# Patient Record
Sex: Male | Born: 1970 | Race: White | Hispanic: No | Marital: Single | Smoking: Current every day smoker
Health system: Southern US, Community
[De-identification: ages and names within clinical notes are randomized; demographics above are authoritative.]

## PROBLEM LIST (undated history)

## (undated) DIAGNOSIS — M1990 Unspecified osteoarthritis, unspecified site: Secondary | ICD-10-CM

## (undated) DIAGNOSIS — K219 Gastro-esophageal reflux disease without esophagitis: Secondary | ICD-10-CM

## (undated) DIAGNOSIS — R519 Headache, unspecified: Secondary | ICD-10-CM

## (undated) DIAGNOSIS — I10 Essential (primary) hypertension: Secondary | ICD-10-CM

## (undated) HISTORY — PX: KNEE ARTHROSCOPY: SUR90

## (undated) HISTORY — PX: ADENOIDECTOMY: SUR15

---

## 2003-04-06 ENCOUNTER — Encounter: Payer: Self-pay | Admitting: Emergency Medicine

## 2003-04-06 ENCOUNTER — Emergency Department (HOSPITAL_COMMUNITY): Admission: EM | Admit: 2003-04-06 | Discharge: 2003-04-06 | Payer: Self-pay | Admitting: Emergency Medicine

## 2006-03-28 ENCOUNTER — Emergency Department: Payer: Self-pay | Admitting: Emergency Medicine

## 2007-10-16 ENCOUNTER — Emergency Department (HOSPITAL_COMMUNITY): Admission: EM | Admit: 2007-10-16 | Discharge: 2007-10-16 | Payer: Self-pay | Admitting: Emergency Medicine

## 2007-10-19 ENCOUNTER — Emergency Department (HOSPITAL_COMMUNITY): Admission: EM | Admit: 2007-10-19 | Discharge: 2007-10-19 | Payer: Self-pay | Admitting: Emergency Medicine

## 2007-11-21 ENCOUNTER — Emergency Department: Payer: Self-pay | Admitting: Internal Medicine

## 2008-03-03 ENCOUNTER — Emergency Department: Payer: Self-pay | Admitting: Emergency Medicine

## 2008-04-10 ENCOUNTER — Emergency Department: Payer: Self-pay | Admitting: Internal Medicine

## 2008-04-14 ENCOUNTER — Emergency Department: Payer: Self-pay | Admitting: Emergency Medicine

## 2008-05-30 ENCOUNTER — Emergency Department: Payer: Self-pay | Admitting: Emergency Medicine

## 2008-05-30 ENCOUNTER — Ambulatory Visit: Payer: Self-pay | Admitting: Neurology

## 2008-06-02 ENCOUNTER — Emergency Department: Payer: Self-pay | Admitting: Emergency Medicine

## 2008-06-05 ENCOUNTER — Emergency Department: Payer: Self-pay | Admitting: Emergency Medicine

## 2009-04-15 ENCOUNTER — Ambulatory Visit: Payer: Self-pay | Admitting: Internal Medicine

## 2009-05-29 ENCOUNTER — Emergency Department: Payer: Self-pay | Admitting: Internal Medicine

## 2011-01-18 ENCOUNTER — Emergency Department: Payer: Self-pay | Admitting: Emergency Medicine

## 2011-04-11 LAB — CBC
HCT: 46.1
MCHC: 35.5
MCV: 87.2
RBC: 5.28

## 2011-04-11 LAB — POCT I-STAT, CHEM 8
BUN: 17
Creatinine, Ser: 1.3
Hemoglobin: 16
Sodium: 136
TCO2: 28

## 2011-04-11 LAB — DIFFERENTIAL
Basophils Absolute: 0.1
Eosinophils Absolute: 0.2
Lymphs Abs: 2.1
Monocytes Relative: 6

## 2011-04-11 LAB — ROCKY MTN SPOTTED FVR AB, IGM-BLOOD: RMSF IgM: 0.21 IV

## 2011-04-11 LAB — ROCKY MTN SPOTTED FVR AB, IGG-BLOOD: RMSF IgG: <1:64 {titer}

## 2011-07-29 ENCOUNTER — Emergency Department: Payer: Self-pay | Admitting: Unknown Physician Specialty

## 2011-07-29 LAB — COMPREHENSIVE METABOLIC PANEL
Anion Gap: 11 (ref 7–16)
Bilirubin,Total: 0.9 mg/dL (ref 0.2–1.0)
Chloride: 103 mmol/L (ref 98–107)
Co2: 26 mmol/L (ref 21–32)
Creatinine: 1.03 mg/dL (ref 0.60–1.30)
EGFR (African American): >60
EGFR (Non-African Amer.): >60
Glucose: 121 mg/dL — ABNORMAL HIGH (ref 65–99)
Potassium: 4 mmol/L (ref 3.5–5.1)
SGPT (ALT): 37 U/L
Sodium: 140 mmol/L (ref 136–145)

## 2011-07-29 LAB — CBC
HCT: 49.1 % (ref 40.0–52.0)
HGB: 17 g/dL (ref 13.0–18.0)
MCHC: 34.7 g/dL (ref 32.0–36.0)
Platelet: 229 10*3/uL (ref 150–440)

## 2011-07-29 LAB — TROPONIN I: Troponin-I: <0.02 ng/mL

## 2013-09-09 ENCOUNTER — Ambulatory Visit: Payer: Self-pay

## 2017-01-31 ENCOUNTER — Ambulatory Visit (INDEPENDENT_AMBULATORY_CARE_PROVIDER_SITE_OTHER): Payer: BLUE CROSS/BLUE SHIELD | Admitting: Adult Health

## 2017-01-31 ENCOUNTER — Encounter: Payer: Self-pay | Admitting: Adult Health

## 2017-01-31 VITALS — BP 152/106 | HR 101 | Ht 68.0 in | Wt 247.0 lb

## 2017-01-31 DIAGNOSIS — L0232 Furuncle of buttock: Secondary | ICD-10-CM

## 2017-01-31 DIAGNOSIS — L0291 Cutaneous abscess, unspecified: Secondary | ICD-10-CM | POA: Insufficient documentation

## 2017-01-31 DIAGNOSIS — Z0000 Encounter for general adult medical examination without abnormal findings: Secondary | ICD-10-CM

## 2017-01-31 DIAGNOSIS — I10 Essential (primary) hypertension: Secondary | ICD-10-CM | POA: Insufficient documentation

## 2017-01-31 MED ORDER — LISINOPRIL 10 MG PO TABS: 10.0000 mg | ORAL_TABLET | Freq: Every day | ORAL | 3 refills | Status: DC

## 2017-01-31 MED ORDER — DOXYCYCLINE HYCLATE 100 MG PO TABS: 100.0000 mg | ORAL_TABLET | Freq: Two times a day (BID) | ORAL | 0 refills | Status: DC

## 2017-01-31 NOTE — Patient Instructions (Signed)
Hypertension Hypertension, commonly called high blood pressure, is when the force of blood pumping through the arteries is too strong. The arteries are the blood vessels that carry blood from the heart throughout the body. Hypertension forces the heart to work harder to pump blood and may cause arteries to become narrow or stiff. Having untreated or uncontrolled hypertension can cause heart attacks, strokes, kidney disease, and other problems. A blood pressure reading consists of a higher number over a lower number. Ideally, your blood pressure should be below 120/80. The first ("top") number is called the systolic pressure. It is a measure of the pressure in your arteries as your heart beats. The second ("bottom") number is called the diastolic pressure. It is a measure of the pressure in your arteries as the heart relaxes. What are the causes? The cause of this condition is not known. What increases the risk? Some risk factors for high blood pressure are under your control. Others are not. Factors you can change  Smoking.  Having type 2 diabetes mellitus, high cholesterol, or both.  Not getting enough exercise or physical activity.  Being overweight.  Having too much fat, sugar, calories, or salt (sodium) in your diet.  Drinking too much alcohol. Factors that are difficult or impossible to change  Having chronic kidney disease.  Having a family history of high blood pressure.  Age. Risk increases with age.  Race. You may be at higher risk if you are African-American.  Gender. Men are at higher risk than women before age 45. After age 65, women are at higher risk than men.  Having obstructive sleep apnea.  Stress. What are the signs or symptoms? Extremely high blood pressure (hypertensive crisis) may cause:  Headache.  Anxiety.  Shortness of breath.  Nosebleed.  Nausea and vomiting.  Severe chest pain.  Jerky movements you cannot control (seizures).  How is this  diagnosed? This condition is diagnosed by measuring your blood pressure while you are seated, with your arm resting on a surface. The cuff of the blood pressure monitor will be placed directly against the skin of your upper arm at the level of your heart. It should be measured at least twice using the same arm. Certain conditions can cause a difference in blood pressure between your right and left arms. Certain factors can cause blood pressure readings to be lower or higher than normal (elevated) for a short period of time:  When your blood pressure is higher when you are in a health care provider's office than when you are at home, this is called white coat hypertension. Most people with this condition do not need medicines.  When your blood pressure is higher at home than when you are in a health care provider's office, this is called masked hypertension. Most people with this condition may need medicines to control blood pressure.  If you have a high blood pressure reading during one visit or you have normal blood pressure with other risk factors:  You may be asked to return on a different day to have your blood pressure checked again.  You may be asked to monitor your blood pressure at home for 1 week or longer.  If you are diagnosed with hypertension, you may have other blood or imaging tests to help your health care provider understand your overall risk for other conditions. How is this treated? This condition is treated by making healthy lifestyle changes, such as eating healthy foods, exercising more, and reducing your alcohol intake. Your   health care provider may prescribe medicine if lifestyle changes are not enough to get your blood pressure under control, and if:  Your systolic blood pressure is above 130.  Your diastolic blood pressure is above 80.  Your personal target blood pressure may vary depending on your medical conditions, your age, and other factors. Follow these  instructions at home: Eating and drinking  Eat a diet that is high in fiber and potassium, and low in sodium, added sugar, and fat. An example eating plan is called the DASH (Dietary Approaches to Stop Hypertension) diet. To eat this way: ? Eat plenty of fresh fruits and vegetables. Try to fill half of your plate at each meal with fruits and vegetables. ? Eat whole grains, such as whole wheat pasta, brown rice, or whole grain bread. Fill about one quarter of your plate with whole grains. ? Eat or drink low-fat dairy products, such as skim milk or low-fat yogurt. ? Avoid fatty cuts of meat, processed or cured meats, and poultry with skin. Fill about one quarter of your plate with lean proteins, such as fish, chicken without skin, beans, eggs, and tofu. ? Avoid premade and processed foods. These tend to be higher in sodium, added sugar, and fat.  Reduce your daily sodium intake. Most people with hypertension should eat less than 1,500 mg of sodium a day.  Limit alcohol intake to no more than 1 drink a day for nonpregnant women and 2 drinks a day for men. One drink equals 12 oz of beer, 5 oz of wine, or 1 oz of hard liquor. Lifestyle  Work with your health care provider to maintain a healthy body weight or to lose weight. Ask what an ideal weight is for you.  Get at least 30 minutes of exercise that causes your heart to beat faster (aerobic exercise) most days of the week. Activities may include walking, swimming, or biking.  Include exercise to strengthen your muscles (resistance exercise), such as pilates or lifting weights, as part of your weekly exercise routine. Try to do these types of exercises for 30 minutes at least 3 days a week.  Do not use any products that contain nicotine or tobacco, such as cigarettes and e-cigarettes. If you need help quitting, ask your health care provider.  Monitor your blood pressure at home as told by your health care provider.  Keep all follow-up visits as  told by your health care provider. This is important. Medicines  Take over-the-counter and prescription medicines only as told by your health care provider. Follow directions carefully. Blood pressure medicines must be taken as prescribed.  Do not skip doses of blood pressure medicine. Doing this puts you at risk for problems and can make the medicine less effective.  Ask your health care provider about side effects or reactions to medicines that you should watch for. Contact a health care provider if:  You think you are having a reaction to a medicine you are taking.  You have headaches that keep coming back (recurring).  You feel dizzy.  You have swelling in your ankles.  You have trouble with your vision. Get help right away if:  You develop a severe headache or confusion.  You have unusual weakness or numbness.  You feel faint.  You have severe pain in your chest or abdomen.  You vomit repeatedly.  You have trouble breathing. Summary  Hypertension is when the force of blood pumping through your arteries is too strong. If this condition is not   controlled, it may put you at risk for serious complications.  Your personal target blood pressure may vary depending on your medical conditions, your age, and other factors. For most people, a normal blood pressure is less than 120/80.  Hypertension is treated with lifestyle changes, medicines, or a combination of both. Lifestyle changes include weight loss, eating a healthy, low-sodium diet, exercising more, and limiting alcohol. This information is not intended to replace advice given to you by your health care provider. Make sure you discuss any questions you have with your health care provider. Document Released: 07/04/2005 Document Revised: 06/01/2016 Document Reviewed: 06/01/2016 Elsevier Interactive Patient Education  2018 [employer].   Skin Abscess A skin abscess is an infected area on or under your skin that contains  a collection of pus and other material. An abscess may also be called a furuncle, carbuncle, or boil. An abscess can occur in or on almost any part of your body. Some abscesses break open (rupture) on their own. Most continue to get worse unless they are treated. The infection can spread deeper into the body and eventually into your blood, which can make you feel ill. Treatment usually involves draining the abscess. What are the causes? An abscess occurs when germs, often bacteria, pass through your skin and cause an infection. This may be caused by:  A scrape or cut on your skin.  A puncture wound through your skin, including a needle injection.  Blocked oil or sweat glands.  Blocked and infected hair follicles.  A cyst that forms beneath your skin (sebaceous cyst) and becomes infected.  What increases the risk? This condition is more likely to develop in people who:  Have a weak body defense system (immune system).  Have diabetes.  Have dry and irritated skin.  Get frequent injections or use illegal IV drugs.  Have a foreign body in a wound, such as a splinter.  Have problems with their lymph system or veins.  What are the signs or symptoms? An abscess may start as a painful, firm bump under the skin. Over time, the abscess may get larger or become softer. Pus may appear at the top of the abscess, causing pressure and pain. It may eventually break through the skin and drain. Other symptoms include:  Redness.  Warmth.  Swelling.  Tenderness.  A sore on the skin.  How is this diagnosed? This condition is diagnosed based on your medical history and a physical exam. A sample of pus may be taken from the abscess to find out what is causing the infection and what antibiotics can be used to treat it. You also may have:  Blood tests to look for signs of infection or spread of an infection to your blood.  Imaging studies such as ultrasound, CT scan, or MRI if the abscess is  deep.  How is this treated? Small abscesses that drain on their own may not need treatment. Treatment for an abscess that does not rupture on its own may include:  Warm compresses applied to the area several times per day.  Incision and drainage. Your health care provider will make an incision to open the abscess and will remove pus and any foreign body or dead tissue. The incision area may be packed with gauze to keep it open for a few days while it heals.  Antibiotic medicines to treat infection. For a severe abscess, you may first get antibiotics through an IV and then change to oral antibiotics.  Follow these instructions  at home: Abscess Care  If you have an abscess that has not drained, place a warm, clean, wet washcloth over the abscess several times a day. Do this as told by your health care provider.  Follow instructions from your health care provider about how to take care of your abscess. Make sure you: ? Cover the abscess with a bandage (dressing). ? Change your dressing or gauze as told by your health care provider. ? Wash your hands with soap and water before you change the dressing or gauze. If soap and water are not available, use hand sanitizer.  Check your abscess every day for signs of a worsening infection. Check for: ? More redness, swelling, or pain. ? More fluid or blood. ? Warmth. ? More pus or a bad smell. Medicines  Take over-the-counter and prescription medicines only as told by your health care provider.  If you were prescribed an antibiotic medicine, take it as told by your health care provider. Do not stop taking the antibiotic even if you start to feel better. General instructions  To avoid spreading the infection: ? Do not share personal care items, towels, or hot tubs with others. ? Avoid making skin contact with other people.  Keep all follow-up visits as told by your health care provider. This is important. Contact a health care provider  if:  You have more redness, swelling, or pain around your abscess.  You have more fluid or blood coming from your abscess.  Your abscess feels warm to the touch.  You have more pus or a bad smell coming from your abscess.  You have a fever.  You have muscle aches.  You have chills or a general ill feeling. Get help right away if:  You have severe pain.  You see red streaks on your skin spreading away from the abscess. This information is not intended to replace advice given to you by your health care provider. Make sure you discuss any questions you have with your health care provider. Document Released: 04/13/2005 Document Revised: 02/28/2016 Document Reviewed: 05/13/2015 Elsevier Interactive Patient Education  2018 [employer]  Due to PCN allergy, started on Doxycycline 100mg  twice a day for 10 days-AVOID THE SUN! Keep abscess clean, dry, covered. Will call when lab results are available will call. Start daily Lisinopril 10mg .  Please check BP several times a week ([facility]) and bring log to next appt in 4 weeks. WELCOME TO THE PRACTICE

## 2017-01-31 NOTE — Assessment & Plan Note (Signed)
Hx of HTN and previous antihypertensives. Both BP checks today > 150/100 BMP checked and started on Lisinopril 10mg  daily. Check BP several times week, bring log to next OV. F/u 4 weeks.

## 2017-01-31 NOTE — Progress Notes (Signed)
Subjective:    Patient ID: Gregory Melton, male    DOB: 29-Nov-1970, 46 y.o.   MRN: [id number]  HPI:  Gregory Melton is here to establish as a new pt.  He is a pleasant 46 year old male. PMH:  HTN and recurrent abscesses. He has not had regular medical care > 5 years and has active abscess- anus and R axillary area.  Abscesses formed > 2 weeks ago and the anal abscess spontaneously ruptured this past sat that yielded copious thick yellow/white drainage.  He denies fever/night sweats/poor appetite. He reports calling several UC for treat and he states they couldn't see him "bc we don't look at private parts"-??? He reports drinking > gallon water/day, eats a diet of mostly lean protein , fruits/vegatables/CHOs.  He smokes 1/2 pack a day and has been using tobacco for > 20 years.  He reports drinking lite beer several times a week-however does not interfere with interpersonal relationships or vocational requirements.  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 Of Note:  Denies hx of MRSA infections or anyone at home with active infection.  Patient Care Team    Relationship Specialty Notifications Start End  Gregory Fusi, NP PCP - General Family Medicine  01/31/17     Patient Active Problem List   Diagnosis Date Noted  . Abscess 01/31/2017  . HTN (hypertension) 01/31/2017  . Boil of buttock 01/31/2017  . Healthcare maintenance 01/31/2017     History reviewed. No pertinent past medical history.   Past Surgical History:  Procedure Laterality Date  . ADENOIDECTOMY    . KNEE ARTHROSCOPY Right      Family History  Problem Relation Age of Onset  . Diabetes Maternal Grandmother      History  Drug Use No     History  Alcohol Use  . 3.6 oz/week  . 6 Cans of beer per week     History  Smoking Status  . Current Every Day Smoker  . Packs/day: 0.50  . Types: Cigarettes  . Start date: 07/19/1995  Smokeless Tobacco  . Never Used     Outpatient Encounter Prescriptions as  of 01/31/2017  Medication Sig  . ibuprofen (ADVIL,MOTRIN) 200 MG tablet Take 200 mg by mouth as needed.  . doxycycline (VIBRA-TABS) 100 MG tablet Take 1 tablet (100 mg total) by mouth 2 (two) times daily.  Marland Kitchen lisinopril (PRINIVIL,ZESTRIL) 10 MG tablet Take 1 tablet (10 mg total) by mouth daily.   No facility-administered encounter medications on file as of 01/31/2017.     Allergies: Penicillins  Body mass index is 37.56 kg/m.  Blood pressure (!) 152/106, pulse (!) 101, height 5\' 8"  (1.727 m), weight 247 lb (112 kg).     Review of Systems  Constitutional: Positive for fatigue. Negative for activity change, appetite change, chills, diaphoresis, fever and unexpected weight change.  Eyes: Negative for visual disturbance.  Respiratory: Negative for cough, chest tightness, shortness of breath and wheezing.   Cardiovascular: Negative for chest pain, palpitations and leg swelling.  Gastrointestinal: Negative for abdominal distention, abdominal pain, blood in stool, constipation, diarrhea, nausea and vomiting.  Endocrine: Negative for cold intolerance, heat intolerance, polydipsia, polyphagia and polyuria.  Genitourinary: Negative for difficulty urinating, discharge, flank pain, genital sores, hematuria and penile swelling.  Musculoskeletal: Negative for arthralgias, back pain, gait problem, joint swelling, myalgias and neck stiffness.  Skin: Positive for wound. Negative for color change, pallor and rash.  Neurological: Negative for dizziness, tremors, weakness and headaches.  Hematological: Does not bruise/bleed easily.  Psychiatric/Behavioral: Positive for sleep disturbance.       Objective:   Physical Exam  Constitutional: He is oriented to person, place, and time. He appears well-developed and well-nourished. No distress.  HENT:  Head: Normocephalic and atraumatic.  Right Ear: External ear normal.  Left Ear: External ear normal.  Eyes: Pupils are equal, round, and reactive to  light. Conjunctivae are normal.  Neck: Normal range of motion. Neck supple.  Cardiovascular: Normal rate, regular rhythm, normal heart sounds and intact distal pulses.   No murmur heard. Pulmonary/Chest: Effort normal and breath sounds normal. No respiratory distress. He has no wheezes. He has no rales. He exhibits no tenderness.  Abdominal: Soft. Bowel sounds are normal. He exhibits no distension and no mass. There is no tenderness. There is no rebound and no guarding.  Lymphadenopathy:    He has no cervical adenopathy.  Neurological: He is alert and oriented to person, place, and time.  Skin: Skin is warm and dry. No rash noted. He is not diaphoretic. No erythema. No pallor.     One discrete, open abscess noted on inner aspect of R gluteal fold. Scant clear drainage collected on swab when abscess palpated.  Surrounding tissue intact. No streaking/excessive warmth/redness noted.  Intact raised area of redness R axillary area.  No excessive warmth/streaking noted.  No open tissue noted.   Psychiatric: He has a normal mood and affect. His behavior is normal. Judgment and thought content normal.  Nursing note and vitals reviewed.         Assessment & Plan:   1. Boil of buttock   2. Abscess   3. Essential hypertension   4. Healthcare maintenance     Abscess Anal abscess swabbed-scant to no drainage noted.  Swab sent for culture. PCN allergy Doxy 100mg  BID x 10 days-instructed to avoid sun exposure. Red Flag sx's discussed-advised to call clinic if any noted.   Boil of buttock Anal abscess swabbed-scant to no drainage noted.  Swab sent for culture. PCN allergy Doxy 100mg  BID x 10 days-instructed to avoid sun exposure. Red Flag sx's discussed-advised to call clinic if any noted.  HTN (hypertension) Hx of HTN and previous antihypertensives. Both BP checks today > 150/100 BMP checked and started on Lisinopril 10mg  daily. Check BP several times week, bring log to next  OV. F/u 4 weeks.    FOLLOW-UP:  Return in about 4 weeks (around 02/28/2017) for Regular Follow Up, HTN, Evaluate Medication Effectiveness.

## 2017-01-31 NOTE — Assessment & Plan Note (Signed)
Anal abscess swabbed-scant to no drainage noted.  Swab sent for culture. PCN allergy Doxy 100mg BID x 10 days-instructed to avoid sun exposure. Red Flag sx's discussed-advised to call clinic if any noted. 

## 2017-01-31 NOTE — Assessment & Plan Note (Signed)
Anal abscess swabbed-scant to no drainage noted.  Swab sent for culture. PCN allergy Doxy 100mg  BID x 10 days-instructed to avoid sun exposure. Red Flag sx's discussed-advised to call clinic if any noted.

## 2017-02-01 LAB — BASIC METABOLIC PANEL
BUN/Creatinine Ratio: 15 (ref 9–20)
BUN: 15 mg/dL (ref 6–24)
CALCIUM: 9.7 mg/dL (ref 8.7–10.2)
CO2: 24 mmol/L (ref 20–29)
CREATININE: 0.99 mg/dL (ref 0.76–1.27)
Chloride: 98 mmol/L (ref 96–106)
GFR calc Af Amer: 107 mL/min/{1.73_m2} (ref >59)
GFR, EST NON AFRICAN AMERICAN: 92 mL/min/{1.73_m2} (ref >59)
GLUCOSE: 90 mg/dL (ref 65–99)
Potassium: 4.7 mmol/L (ref 3.5–5.2)
SODIUM: 141 mmol/L (ref 134–144)

## 2017-02-04 LAB — WOUND CULTURE

## 2017-02-06 ENCOUNTER — Encounter: Payer: Self-pay | Admitting: Adult Health

## 2017-02-06 ENCOUNTER — Ambulatory Visit: Payer: BLUE CROSS/BLUE SHIELD | Admitting: Family

## 2017-02-06 ENCOUNTER — Other Ambulatory Visit: Payer: Self-pay | Admitting: Adult Health

## 2017-02-06 MED ORDER — SULFAMETHOXAZOLE-TRIMETHOPRIM 400-80 MG PO TABS: 1.0000 | ORAL_TABLET | Freq: Two times a day (BID) | ORAL | 0 refills | Status: DC

## 2017-02-28 ENCOUNTER — Encounter: Payer: Self-pay | Admitting: Adult Health

## 2017-02-28 ENCOUNTER — Ambulatory Visit (INDEPENDENT_AMBULATORY_CARE_PROVIDER_SITE_OTHER): Payer: BLUE CROSS/BLUE SHIELD | Admitting: Adult Health

## 2017-02-28 VITALS — BP 112/79 | HR 88 | Ht 68.0 in | Wt 245.7 lb

## 2017-02-28 DIAGNOSIS — I10 Essential (primary) hypertension: Secondary | ICD-10-CM | POA: Diagnosis not present

## 2017-02-28 DIAGNOSIS — L0293 Carbuncle, unspecified: Secondary | ICD-10-CM | POA: Diagnosis not present

## 2017-02-28 DIAGNOSIS — L0232 Furuncle of buttock: Secondary | ICD-10-CM | POA: Diagnosis not present

## 2017-02-28 NOTE — Assessment & Plan Note (Signed)
BP at goal 112/79, HR 88 Continue Lisinopril 10mg  daily and heart healthy diet. Continue to push water, avoid soda. CPE with fasting labs in 3-4 months.

## 2017-02-28 NOTE — Progress Notes (Signed)
Subjective:    Patient ID: Gregory Melton, male    DOB: August 19, 1970, 46 y.o.   MRN: [id number]  HPI:  [name] is here for f/u: HTN and recurrent boils.  He has been compliant on Lisinopril 10mg  daily and has been reducing saturated fat/Na++ in diet.   He denies CP/dyspnea/palpitations/dry-cough. He completed Bactrim for boil infection, however boil is still present.  He denies any drainage/pain at site of boil.  He suffered R hand crush injury 2012 and was treated with splinting and large doses of neuropathy medications.  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  He has never been seen by ID or Dermatology for this condition.    Patient Care Team    Relationship Specialty Notifications Start End  Julaine Fusianford, Alianis Trimmer D, NP PCP - General Family Medicine  01/31/17     Patient Active Problem List   Diagnosis Date Noted  . Abscess 01/31/2017  . HTN (hypertension) 01/31/2017  . Boil of buttock 01/31/2017  . Healthcare maintenance 01/31/2017     History reviewed. No pertinent past medical history.   Past Surgical History:  Procedure Laterality Date  . ADENOIDECTOMY    . KNEE ARTHROSCOPY Right      Family History  Problem Relation Age of Onset  . Diabetes Maternal Grandmother      History  Drug Use No     History  Alcohol Use  . 3.6 oz/week  . 6 Cans of beer per week     History  Smoking Status  . Current Every Day Smoker  . Packs/day: 0.50  . Types: Cigarettes  . Start date: 07/19/1995  Smokeless Tobacco  . Never Used     Outpatient Encounter Prescriptions as of 02/28/2017  Medication Sig  . ibuprofen (ADVIL,MOTRIN) 200 MG tablet Take 200 mg by mouth as needed.  Marland Kitchen. lisinopril (PRINIVIL,ZESTRIL) 10 MG tablet Take 1 tablet (10 mg total) by mouth daily.  . [DISCONTINUED] sulfamethoxazole-trimethoprim (BACTRIM,SEPTRA) 400-80 MG tablet Take 1 tablet by mouth 2 (two) times daily.   No facility-administered encounter  medications on file as of 02/28/2017.     Allergies: Penicillins  Body mass index is 37.36 kg/m.  Blood pressure 112/79, pulse 88, height 5\' 8"  (1.727 m), weight 245 lb 11.2 oz (111.4 kg).    Review of Systems  Constitutional: Positive for fatigue. Negative for activity change, appetite change, chills, diaphoresis, fever and unexpected weight change.  Eyes: Negative for visual disturbance.  Respiratory: Negative for cough, chest tightness, shortness of breath, wheezing and stridor.   Cardiovascular: Negative for chest pain, palpitations and leg swelling.  Skin: Positive for wound.  Neurological: Negative for dizziness, weakness and headaches.  Hematological: Does not bruise/bleed easily.  Psychiatric/Behavioral: Negative for decreased concentration and sleep disturbance.       Objective:   Physical Exam  Constitutional: He appears well-developed and well-nourished. No distress.  HENT:  Head: Normocephalic and atraumatic.  Right Ear: External ear normal.  Left Ear: External ear normal.  Cardiovascular: Normal rate, regular rhythm, normal heart sounds and intact distal pulses.   No murmur heard. Pulmonary/Chest: Effort normal and breath sounds normal. No respiratory distress. He has no wheezes. He has no rales. He exhibits no tenderness.  Skin: Skin is warm and dry. No rash noted. He is not diaphoretic. No erythema. No pallor.  Psychiatric: He has a normal mood and affect. His behavior is normal. Judgment and thought content normal.  Nursing note  and vitals reviewed.         Assessment & Plan:   1. Recurrent boils   2. Essential hypertension   3. Boil of buttock     HTN (hypertension) BP at goal 112/79, HR 88 Continue Lisinopril 10mg  daily and heart healthy diet. Continue to push water, avoid soda. CPE with fasting labs in 3-4 months.  Boil of buttock Continues despite completed course of Bactrim. This has been chronic condition since 2012. Dermatology  referral placed.    FOLLOW-UP:  Return in about 3 months (around 05/31/2017) for CPE.

## 2017-02-28 NOTE — Patient Instructions (Signed)
Hypertension Hypertension, commonly called high blood pressure, is when the force of blood pumping through the arteries is too strong. The arteries are the blood vessels that carry blood from the heart throughout the body. Hypertension forces the heart to work harder to pump blood and may cause arteries to become narrow or stiff. Having untreated or uncontrolled hypertension can cause heart attacks, strokes, kidney disease, and other problems. A blood pressure reading consists of a higher number over a lower number. Ideally, your blood pressure should be below 120/80. The first ("top") number is called the systolic pressure. It is a measure of the pressure in your arteries as your heart beats. The second ("bottom") number is called the diastolic pressure. It is a measure of the pressure in your arteries as the heart relaxes. What are the causes? The cause of this condition is not known. What increases the risk? Some risk factors for high blood pressure are under your control. Others are not. Factors you can change  Smoking.  Having type 2 diabetes mellitus, high cholesterol, or both.  Not getting enough exercise or physical activity.  Being overweight.  Having too much fat, sugar, calories, or salt (sodium) in your diet.  Drinking too much alcohol. Factors that are difficult or impossible to change  Having chronic kidney disease.  Having a family history of high blood pressure.  Age. Risk increases with age.  Race. You may be at higher risk if you are African-American.  Gender. Men are at higher risk than women before age 45. After age 65, women are at higher risk than men.  Having obstructive sleep apnea.  Stress. What are the signs or symptoms? Extremely high blood pressure (hypertensive crisis) may cause:  Headache.  Anxiety.  Shortness of breath.  Nosebleed.  Nausea and vomiting.  Severe chest pain.  Jerky movements you cannot control (seizures).  How is this  diagnosed? This condition is diagnosed by measuring your blood pressure while you are seated, with your arm resting on a surface. The cuff of the blood pressure monitor will be placed directly against the skin of your upper arm at the level of your heart. It should be measured at least twice using the same arm. Certain conditions can cause a difference in blood pressure between your right and left arms. Certain factors can cause blood pressure readings to be lower or higher than normal (elevated) for a short period of time:  When your blood pressure is higher when you are in a health care provider's office than when you are at home, this is called white coat hypertension. Most people with this condition do not need medicines.  When your blood pressure is higher at home than when you are in a health care provider's office, this is called masked hypertension. Most people with this condition may need medicines to control blood pressure.  If you have a high blood pressure reading during one visit or you have normal blood pressure with other risk factors:  You may be asked to return on a different day to have your blood pressure checked again.  You may be asked to monitor your blood pressure at home for 1 week or longer.  If you are diagnosed with hypertension, you may have other blood or imaging tests to help your health care provider understand your overall risk for other conditions. How is this treated? This condition is treated by making healthy lifestyle changes, such as eating healthy foods, exercising more, and reducing your alcohol intake. Your   health care provider may prescribe medicine if lifestyle changes are not enough to get your blood pressure under control, and if:  Your systolic blood pressure is above 130.  Your diastolic blood pressure is above 80.  Your personal target blood pressure may vary depending on your medical conditions, your age, and other factors. Follow these  instructions at home: Eating and drinking  Eat a diet that is high in fiber and potassium, and low in sodium, added sugar, and fat. An example eating plan is called the DASH (Dietary Approaches to Stop Hypertension) diet. To eat this way: ? Eat plenty of fresh fruits and vegetables. Try to fill half of your plate at each meal with fruits and vegetables. ? Eat whole grains, such as whole wheat pasta, brown rice, or whole grain bread. Fill about one quarter of your plate with whole grains. ? Eat or drink low-fat dairy products, such as skim milk or low-fat yogurt. ? Avoid fatty cuts of meat, processed or cured meats, and poultry with skin. Fill about one quarter of your plate with lean proteins, such as fish, chicken without skin, beans, eggs, and tofu. ? Avoid premade and processed foods. These tend to be higher in sodium, added sugar, and fat.  Reduce your daily sodium intake. Most people with hypertension should eat less than 1,500 mg of sodium a day.  Limit alcohol intake to no more than 1 drink a day for nonpregnant women and 2 drinks a day for men. One drink equals 12 oz of beer, 5 oz of wine, or 1 oz of hard liquor. Lifestyle  Work with your health care provider to maintain a healthy body weight or to lose weight. Ask what an ideal weight is for you.  Get at least 30 minutes of exercise that causes your heart to beat faster (aerobic exercise) most days of the week. Activities may include walking, swimming, or biking.  Include exercise to strengthen your muscles (resistance exercise), such as pilates or lifting weights, as part of your weekly exercise routine. Try to do these types of exercises for 30 minutes at least 3 days a week.  Do not use any products that contain nicotine or tobacco, such as cigarettes and e-cigarettes. If you need help quitting, ask your health care provider.  Monitor your blood pressure at home as told by your health care provider.  Keep all follow-up visits as  told by your health care provider. This is important. Medicines  Take over-the-counter and prescription medicines only as told by your health care provider. Follow directions carefully. Blood pressure medicines must be taken as prescribed.  Do not skip doses of blood pressure medicine. Doing this puts you at risk for problems and can make the medicine less effective.  Ask your health care provider about side effects or reactions to medicines that you should watch for. Contact a health care provider if:  You think you are having a reaction to a medicine you are taking.  You have headaches that keep coming back (recurring).  You feel dizzy.  You have swelling in your ankles.  You have trouble with your vision. Get help right away if:  You develop a severe headache or confusion.  You have unusual weakness or numbness.  You feel faint.  You have severe pain in your chest or abdomen.  You vomit repeatedly.  You have trouble breathing. Summary  Hypertension is when the force of blood pumping through your arteries is too strong. If this condition is not   controlled, it may put you at risk for serious complications.  Your personal target blood pressure may vary depending on your medical conditions, your age, and other factors. For most people, a normal blood pressure is less than 120/80.  Hypertension is treated with lifestyle changes, medicines, or a combination of both. Lifestyle changes include weight loss, eating a healthy, low-sodium diet, exercising more, and limiting alcohol. This information is not intended to replace advice given to you by your health care provider. Make sure you discuss any questions you have with your health care provider. Document Released: 07/04/2005 Document Revised: 06/01/2016 Document Reviewed: 06/01/2016 Elsevier Interactive Patient Education  2018 [employer]    Heart-Healthy Eating Plan Many factors influence your heart health, including  eating and exercise habits. Heart (coronary) risk increases with abnormal blood fat (lipid) levels. Heart-healthy meal planning includes limiting unhealthy fats, increasing healthy fats, and making other small dietary changes. This includes maintaining a healthy body weight to help keep lipid levels within a normal range. What is my plan? Your health care provider recommends that you:  Get no more than __25__% of the total calories in your daily diet from fat.  Limit your intake of saturated fat to less than __5__% of your total calories each day.  What types of fat should I choose?  Choose healthy fats more often. Choose monounsaturated and polyunsaturated fats, such as olive oil and canola oil, flaxseeds, walnuts, almonds, and seeds.  Eat more omega-3 fats. Good choices include salmon, mackerel, sardines, tuna, flaxseed oil, and ground flaxseeds. Aim to eat fish at least two times each week.  Limit saturated fats. Saturated fats are primarily found in animal products, such as meats, butter, and cream. Plant sources of saturated fats include palm oil, palm kernel oil, and coconut oil.  Avoid foods with partially hydrogenated oils in them. These contain trans fats. Examples of foods that contain trans fats are stick margarine, some tub margarines, cookies, crackers, and other baked goods. What general guidelines do I need to follow?  Check food labels carefully to identify foods with trans fats or high amounts of saturated fat.  Fill one half of your plate with vegetables and green salads. Eat 4-5 servings of vegetables per day. A serving of vegetables equals 1 cup of raw leafy vegetables,  cup of raw or cooked cut-up vegetables, or  cup of vegetable juice.  Fill one fourth of your plate with whole grains. Look for the word "whole" as the first word in the ingredient list.  Fill one fourth of your plate with lean protein foods.  Eat 4-5 servings of fruit per day. A serving of fruit  equals one medium whole fruit,  cup of dried fruit,  cup of fresh, frozen, or canned fruit, or  cup of 100% fruit juice.  Eat more foods that contain soluble fiber. Examples of foods that contain this type of fiber are apples, broccoli, carrots, beans, peas, and barley. Aim to get 20-30 g of fiber per day.  Eat more home-cooked food and less restaurant, buffet, and fast food.  Limit or avoid alcohol.  Limit foods that are high in starch and sugar.  Avoid fried foods.  Cook foods by using methods other than frying. Baking, boiling, grilling, and broiling are all great options. Other fat-reducing suggestions include: ? Removing the skin from poultry. ? Removing all visible fats from meats. ? Skimming the fat off of stews, soups, and gravies before serving them. ? Steaming vegetables in water or broth.  Lose weight if you are overweight. Losing just 5-10% of your initial body weight can help your overall health and prevent diseases such as diabetes and heart disease.  Increase your consumption of nuts, legumes, and seeds to 4-5 servings per week. One serving of dried beans or legumes equals  cup after being cooked, one serving of nuts equals 1 ounces, and one serving of seeds equals  ounce or 1 tablespoon.  You may need to monitor your salt (sodium) intake, especially if you have high blood pressure. Talk with your health care provider or dietitian to get more information about reducing sodium. What foods can I eat? Grains  Breads, including [place], white, pita, wheat, raisin, rye, oatmeal, and [place]. Tortillas that are neither fried nor made with lard or trans fat. Low-fat rolls, including hotdog and hamburger buns and English muffins. Biscuits. Muffins. Waffles. Pancakes. Light popcorn. Whole-grain cereals. Flatbread. Melba toast. Pretzels. Breadsticks. Rusks. Low-fat snacks and crackers, including oyster, saltine, matzo, graham, animal, and rye. Rice and pasta, including brown rice  and those that are made with whole wheat. Vegetables All vegetables. Fruits All fruits, but limit coconut. Meats and Other Protein Sources Lean, well-trimmed beef, veal, pork, and lamb. Chicken and [place] without skin. All fish and shellfish. Wild duck, rabbit, pheasant, and venison. Egg whites or low-cholesterol egg substitutes. Dried beans, peas, lentils, and tofu.Seeds and most nuts. Dairy Low-fat or nonfat cheeses, including ricotta, string, and mozzarella. Skim or 1% milk that is liquid, powdered, or evaporated. Buttermilk that is made with low-fat milk. Nonfat or low-fat yogurt. Beverages Mineral water. Diet carbonated beverages. Sweets and Desserts Sherbets and fruit ices. Honey, jam, marmalade, jelly, and syrups. Meringues and gelatins. Pure sugar candy, such as hard candy, jelly beans, gumdrops, mints, marshmallows, and small amounts of dark chocolate. [employer]. Eat all sweets and desserts in moderation. Fats and Oils Nonhydrogenated (trans-free) margarines. Vegetable oils, including soybean, sesame, sunflower, olive, peanut, safflower, corn, canola, and cottonseed. Salad dressings or mayonnaise that are made with a vegetable oil. Limit added fats and oils that you use for cooking, baking, salads, and as spreads. Other Cocoa powder. Coffee and tea. All seasonings and condiments. The items listed above may not be a complete list of recommended foods or beverages. Contact your dietitian for more options. What foods are not recommended? Grains Breads that are made with saturated or trans fats, oils, or whole milk. Croissants. Butter rolls. Cheese breads. Sweet rolls. Donuts. Buttered popcorn. Chow mein noodles. High-fat crackers, such as cheese or butter crackers. Meats and Other Protein Sources Fatty meats, such as hotdogs, short ribs, sausage, spareribs, bacon, ribeye roast or steak, and mutton. High-fat deli meats, such as salami and bologna. Caviar. Domestic duck and goose.  Organ meats, such as kidney, liver, sweetbreads, brains, gizzard, chitterlings, and heart. Dairy Cream, sour cream, cream cheese, and creamed cottage cheese. Whole milk cheeses, including blue (bleu), [address], [place], [place], [address], [place], [address], [place], [place], and [place]. Whole or 2% milk that is liquid, evaporated, or condensed. Whole buttermilk. Cream sauce or high-fat cheese sauce. Yogurt that is made from whole milk. Beverages Regular sodas and drinks with added sugar. Sweets and Desserts Frosting. Pudding. Cookies. Cakes other than angel food cake. Candy that has milk chocolate or white chocolate, hydrogenated fat, butter, coconut, or unknown ingredients. Buttered syrups. Full-fat ice cream or ice cream drinks. Fats and Oils Gravy that has suet, meat fat, or shortening. Cocoa butter, hydrogenated oils, palm oil, coconut oil, palm kernel oil. These  can often be found in baked products, candy, fried foods, nondairy creamers, and whipped toppings. Solid fats and shortenings, including bacon fat, salt pork, lard, and butter. Nondairy cream substitutes, such as coffee creamers and sour cream substitutes. Salad dressings that are made of unknown oils, cheese, or sour cream. The items listed above may not be a complete list of foods and beverages to avoid. Contact your dietitian for more information. This information is not intended to replace advice given to you by your health care provider. Make sure you discuss any questions you have with your health care provider. Document Released: 04/12/2008 Document Revised: 01/22/2016 Document Reviewed: 12/26/2013 Elsevier Interactive Patient Education  2017 Elsevier Inc.  Continue Lisinopril 10mg  daily-your blood pressure looks GREAT! Continue excellent water intake and meal prep heart heathy diet. Dermatology referral placed to address recurrent boils. Please schedule complete physical with fasting labs in 3-4 months. AGAIN GREAT JOB ON THE  BLOOD PRESSURE!!!

## 2017-02-28 NOTE — Assessment & Plan Note (Signed)
Continues despite completed course of Bactrim. This has been chronic condition since 2012. Dermatology referral placed.

## 2017-03-28 DIAGNOSIS — L732 Hidradenitis suppurativa: Secondary | ICD-10-CM | POA: Diagnosis not present

## 2017-03-28 DIAGNOSIS — L089 Local infection of the skin and subcutaneous tissue, unspecified: Secondary | ICD-10-CM | POA: Diagnosis not present

## 2017-03-28 DIAGNOSIS — L0292 Furuncle, unspecified: Secondary | ICD-10-CM | POA: Diagnosis not present

## 2017-05-29 NOTE — Progress Notes (Signed)
Subjective:    Patient ID: Gregory Melton, male    DOB: 1971/03/12, 46 y.o.   MRN: [id number]  HPI: 02/28/17 OV: Gregory Melton is here for f/u: HTN and recurrent boils.  He has been compliant on Lisinopril 10mg  daily and has been reducing saturated fat/Na++ in diet.   He denies CP/dyspnea/palpitations/dry-cough. He completed Bactrim for boil infection, however boil is still present.  He denies any drainage/pain at site of boil.  He suffered R hand crush injury 2012 and was treated with splinting and large doses of neuropathy medications.  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  He has never been seen by ID or Dermatology for this condition.    05/31/17 OV: Gregory Melton is here for CPE with fasting labs.  He has only been taking his Lisinopril 10mg  every few days and has developed a dry, constant cough that last month.  He drinks 80 oz water a day and has a diet high in saturated fat/sugar/CHO.  He continues to smoke, 4-5 cigarettes/day.  He does not exercise regularly.  He reports being tearful and difficulty sleeping since his 29 year old son left for [employer] 3 days ago.  He is under care of dermatology for recurrent boils. Healthcare maintenance: Colonoscopy-not indicated   Patient Care Team    Relationship Specialty Notifications Start End  [place], Jinny Blossom, NP PCP - General Family Medicine  01/31/17     Patient Active Problem List   Diagnosis Date Noted  . Abscess 01/31/2017  . HTN (hypertension) 01/31/2017  . Boil of buttock 01/31/2017  . Healthcare maintenance 01/31/2017     History reviewed. No pertinent past medical history.   Past Surgical History:  Procedure Laterality Date  . ADENOIDECTOMY    . KNEE ARTHROSCOPY Right      Family History  Problem Relation Age of Onset  . Diabetes Maternal Grandmother      Social History   Substance and Sexual Activity  Drug Use No     Social History   Substance and  Sexual Activity  Alcohol Use Yes  . Alcohol/week: 3.6 oz  . Types: 6 Cans of beer per week     Social History   Tobacco Use  Smoking Status Current Every Day Smoker  . Packs/day: 0.50  . Types: Cigarettes  . Start date: 07/19/1995  Smokeless Tobacco Never Used     Outpatient Encounter Medications as of 05/31/2017  Medication Sig  . clindamycin (CLEOCIN) 300 MG capsule Take 300 mg 2 (two) times daily by mouth.  Marland Kitchen ibuprofen (ADVIL,MOTRIN) 200 MG tablet Take 200 mg by mouth as needed.  . [DISCONTINUED] lisinopril (PRINIVIL,ZESTRIL) 10 MG tablet Take 1 tablet (10 mg total) by mouth daily.  Marland Kitchen losartan (COZAAR) 50 MG tablet Take 1 tablet (50 mg total) daily by mouth.   No facility-administered encounter medications on file as of 05/31/2017.     Allergies: Penicillins  Body mass index is 38.38 kg/m.  Blood pressure (!) 157/105, pulse 93, height 5\' 8"  (1.727 m), weight 252 lb 6.4 oz (114.5 kg).    Review of Systems  Constitutional: Positive for fatigue. Negative for activity change, appetite change, chills, diaphoresis, fever and unexpected weight change.  HENT: Positive for congestion.   Eyes: Negative for visual disturbance.  Respiratory: Negative for cough, chest tightness, shortness of breath, wheezing and stridor.   Cardiovascular: Negative for chest pain, palpitations and leg swelling.  Gastrointestinal: Negative for  abdominal distention, anal bleeding, blood in stool, constipation, diarrhea, nausea and vomiting.  Endocrine: Negative for cold intolerance, heat intolerance, polydipsia, polyphagia and polyuria.  Genitourinary: Negative for difficulty urinating, flank pain and hematuria.  Musculoskeletal: Negative for arthralgias, back pain, joint swelling, myalgias, neck pain and neck stiffness.  Skin: Negative for color change, pallor, rash and wound.  Neurological: Negative for dizziness, weakness and headaches.  Hematological: Does not bruise/bleed easily.   Psychiatric/Behavioral: Positive for dysphoric mood and sleep disturbance. Negative for decreased concentration, self-injury and suicidal ideas. The patient is not nervous/anxious and is not hyperactive.        Objective:   Physical Exam  Constitutional: He is oriented to person, place, and time. He appears well-developed and well-nourished. No distress.  HENT:  Head: Normocephalic and atraumatic.  Right Ear: Hearing, tympanic membrane, external ear and ear canal normal. Tympanic membrane is not perforated and not bulging. No decreased hearing is noted.  Left Ear: Hearing, tympanic membrane, external ear and ear canal normal. Tympanic membrane is not perforated and not bulging. No decreased hearing is noted.  Nose: Mucosal edema present. Right sinus exhibits no maxillary sinus tenderness and no frontal sinus tenderness. Left sinus exhibits no maxillary sinus tenderness and no frontal sinus tenderness.  Mouth/Throat: Uvula is midline, oropharynx is clear and moist and mucous membranes are normal. Abnormal dentition. Dental caries present.  Eyes: Conjunctivae are normal. Pupils are equal, round, and reactive to light.  Neck: Normal range of motion. Neck supple.  Cardiovascular: Normal rate, regular rhythm, normal heart sounds and intact distal pulses.  No murmur heard. Pulmonary/Chest: Effort normal and breath sounds normal. No respiratory distress. He has no wheezes. He has no rales. He exhibits no tenderness.  Abdominal: Soft. Bowel sounds are normal. He exhibits no distension and no mass. There is no tenderness. There is no rebound and no guarding.  Protuberant abdomen  Genitourinary: Testes normal. Rectal exam shows guaiac negative stool.  Genitourinary Comments: Unable to feel prostate due to size Chaperone present during examination.  Musculoskeletal: Normal range of motion. He exhibits no edema or tenderness.  Lymphadenopathy:    He has no cervical adenopathy.  Neurological: He is  alert and oriented to person, place, and time. Coordination normal.  Skin: Skin is warm and dry. No rash noted. He is not diaphoretic. No erythema. No pallor.  Psychiatric: He has a normal mood and affect. His behavior is normal. Judgment and thought content normal.  Nursing note and vitals reviewed.         Assessment & Plan:   1. Need for influenza vaccination   2. Healthcare maintenance   3. Hypertension, unspecified type   4. Screening for colon cancer     HTN (hypertension) BP well above goal 157/105, HR 93 R/t not taking medication as directed and emotional stress r/t son recently leaving for [employer]. Developed ACE cough over last month- d/c'd Lisinopril, started on Losartan 50mg  daily Instructed to take daily and record BP each day F/u in 4 weeks  Healthcare maintenance Fasting labs obtained today. Increase water intake, strive for at least 125 ounces/day.   Follow Heart Healthy diet Increase regular exercise.  Recommend at least 30 minutes daily, 5 days per week of walking, jogging, biking, swimming, YouTube/Pinterest workout videos. We will call when you lab results are available. Please check BP daily and bring log to follow-up in 4 weeks.    FOLLOW-UP:  Return in about 4 weeks (around 06/28/2017) for Regular Follow Up, HTN,  Depression. 1. Need for influenza vaccination   2. Healthcare maintenance   3. Hypertension, unspecified type   4. Screening for colon cancer

## 2017-05-31 ENCOUNTER — Ambulatory Visit (INDEPENDENT_AMBULATORY_CARE_PROVIDER_SITE_OTHER): Payer: BLUE CROSS/BLUE SHIELD | Admitting: Adult Health

## 2017-05-31 ENCOUNTER — Encounter: Payer: Self-pay | Admitting: Adult Health

## 2017-05-31 VITALS — BP 157/105 | HR 93 | Ht 68.0 in | Wt 252.4 lb

## 2017-05-31 DIAGNOSIS — Z1211 Encounter for screening for malignant neoplasm of colon: Secondary | ICD-10-CM

## 2017-05-31 DIAGNOSIS — Z23 Encounter for immunization: Secondary | ICD-10-CM

## 2017-05-31 DIAGNOSIS — Z0000 Encounter for general adult medical examination without abnormal findings: Secondary | ICD-10-CM | POA: Diagnosis not present

## 2017-05-31 DIAGNOSIS — I10 Essential (primary) hypertension: Secondary | ICD-10-CM

## 2017-05-31 LAB — POC HEMOCCULT BLD/STL (OFFICE/1-CARD/DIAGNOSTIC): FECAL OCCULT BLD: NEGATIVE

## 2017-05-31 MED ORDER — LOSARTAN POTASSIUM 50 MG PO TABS: 50.0000 mg | ORAL_TABLET | Freq: Every day | ORAL | 3 refills | Status: DC

## 2017-05-31 NOTE — Assessment & Plan Note (Addendum)
Fasting labs obtained today. Increase water intake, strive for at least 125 ounces/day.   Follow Heart Healthy diet Increase regular exercise.  Recommend at least 30 minutes daily, 5 days per week of walking, jogging, biking, swimming, YouTube/Pinterest workout videos. We will call when you lab results are available. Please check BP daily and bring log to follow-up in 4 weeks.

## 2017-05-31 NOTE — Patient Instructions (Signed)
Heart-Healthy Eating Plan Many factors influence your heart health, including eating and exercise habits. Heart (coronary) risk increases with abnormal blood fat (lipid) levels. Heart-healthy meal planning includes limiting unhealthy fats, increasing healthy fats, and making other small dietary changes. This includes maintaining a healthy body weight to help keep lipid levels within a normal range. What is my plan? Your health care provider recommends that you:  Get no more than ___25___% of the total calories in your daily diet from fat.  Limit your intake of saturated fat to less than __5___% of your total calories each day.  Limit the amount of cholesterol in your diet to less than ___300___ mg per day.  What types of fat should I choose?  Choose healthy fats more often. Choose monounsaturated and polyunsaturated fats, such as olive oil and canola oil, flaxseeds, walnuts, almonds, and seeds.  Eat more omega-3 fats. Good choices include salmon, mackerel, sardines, tuna, flaxseed oil, and ground flaxseeds. Aim to eat fish at least two times each week.  Limit saturated fats. Saturated fats are primarily found in animal products, such as meats, butter, and cream. Plant sources of saturated fats include palm oil, palm kernel oil, and coconut oil.  Avoid foods with partially hydrogenated oils in them. These contain trans fats. Examples of foods that contain trans fats are stick margarine, some tub margarines, cookies, crackers, and other baked goods. What general guidelines do I need to follow?  Check food labels carefully to identify foods with trans fats or high amounts of saturated fat.  Fill one half of your plate with vegetables and green salads. Eat 4-5 servings of vegetables per day. A serving of vegetables equals 1 cup of raw leafy vegetables,  cup of raw or cooked cut-up vegetables, or  cup of vegetable juice.  Fill one fourth of your plate with whole grains. Look for the word  "whole" as the first word in the ingredient list.  Fill one fourth of your plate with lean protein foods.  Eat 4-5 servings of fruit per day. A serving of fruit equals one medium whole fruit,  cup of dried fruit,  cup of fresh, frozen, or canned fruit, or  cup of 100% fruit juice.  Eat more foods that contain soluble fiber. Examples of foods that contain this type of fiber are apples, broccoli, carrots, beans, peas, and barley. Aim to get 20-30 g of fiber per day.  Eat more home-cooked food and less restaurant, buffet, and fast food.  Limit or avoid alcohol.  Limit foods that are high in starch and sugar.  Avoid fried foods.  Cook foods by using methods other than frying. Baking, boiling, grilling, and broiling are all great options. Other fat-reducing suggestions include: ? Removing the skin from poultry. ? Removing all visible fats from meats. ? Skimming the fat off of stews, soups, and gravies before serving them. ? Steaming vegetables in water or broth.  Lose weight if you are overweight. Losing just 5-10% of your initial body weight can help your overall health and prevent diseases such as diabetes and heart disease.  Increase your consumption of nuts, legumes, and seeds to 4-5 servings per week. One serving of dried beans or legumes equals  cup after being cooked, one serving of nuts equals 1 ounces, and one serving of seeds equals  ounce or 1 tablespoon.  You may need to monitor your salt (sodium) intake, especially if you have high blood pressure. Talk with your health care provider or dietitian to get  more information about reducing sodium. What foods can I eat? Grains  Breads, including French, white, pita, wheat, raisin, rye, oatmeal, and Italian. Tortillas that are neither fried nor made with lard or trans fat. Low-fat rolls, including hotdog and hamburger buns and English muffins. Biscuits. Muffins. Waffles. Pancakes. Light popcorn. Whole-grain cereals. Flatbread.  Melba toast. Pretzels. Breadsticks. Rusks. Low-fat snacks and crackers, including oyster, saltine, matzo, graham, animal, and rye. Rice and pasta, including brown rice and those that are made with whole wheat. Vegetables All vegetables. Fruits All fruits, but limit coconut. Meats and Other Protein Sources Lean, well-trimmed beef, veal, pork, and lamb. Chicken and turkey without skin. All fish and shellfish. Wild duck, rabbit, pheasant, and venison. Egg whites or low-cholesterol egg substitutes. Dried beans, peas, lentils, and tofu.Seeds and most nuts. Dairy Low-fat or nonfat cheeses, including ricotta, string, and mozzarella. Skim or 1% milk that is liquid, powdered, or evaporated. Buttermilk that is made with low-fat milk. Nonfat or low-fat yogurt. Beverages Mineral water. Diet carbonated beverages. Sweets and Desserts Sherbets and fruit ices. Honey, jam, marmalade, jelly, and syrups. Meringues and gelatins. Pure sugar candy, such as hard candy, jelly beans, gumdrops, mints, marshmallows, and small amounts of dark chocolate. Angel food cake. Eat all sweets and desserts in moderation. Fats and Oils Nonhydrogenated (trans-free) margarines. Vegetable oils, including soybean, sesame, sunflower, olive, peanut, safflower, corn, canola, and cottonseed. Salad dressings or mayonnaise that are made with a vegetable oil. Limit added fats and oils that you use for cooking, baking, salads, and as spreads. Other Cocoa powder. Coffee and tea. All seasonings and condiments. The items listed above may not be a complete list of recommended foods or beverages. Contact your dietitian for more options. What foods are not recommended? Grains Breads that are made with saturated or trans fats, oils, or whole milk. Croissants. Butter rolls. Cheese breads. Sweet rolls. Donuts. Buttered popcorn. Chow mein noodles. High-fat crackers, such as cheese or butter crackers. Meats and Other Protein Sources Fatty meats, such  as hotdogs, short ribs, sausage, spareribs, bacon, ribeye roast or steak, and mutton. High-fat deli meats, such as salami and bologna. Caviar. Domestic duck and goose. Organ meats, such as kidney, liver, sweetbreads, brains, gizzard, chitterlings, and heart. Dairy Cream, sour cream, cream cheese, and creamed cottage cheese. Whole milk cheeses, including blue (bleu), Monterey Jack, Brie, [place], American, Havarti, Swiss, cheddar, Camembert, and [place]. Whole or 2% milk that is liquid, evaporated, or condensed. Whole buttermilk. Cream sauce or high-fat cheese sauce. Yogurt that is made from whole milk. Beverages Regular sodas and drinks with added sugar. Sweets and Desserts Frosting. Pudding. Cookies. Cakes other than angel food cake. Candy that has milk chocolate or white chocolate, hydrogenated fat, butter, coconut, or unknown ingredients. Buttered syrups. Full-fat ice cream or ice cream drinks. Fats and Oils Gravy that has suet, meat fat, or shortening. Cocoa butter, hydrogenated oils, palm oil, coconut oil, palm kernel oil. These can often be found in baked products, candy, fried foods, nondairy creamers, and whipped toppings. Solid fats and shortenings, including bacon fat, salt pork, lard, and butter. Nondairy cream substitutes, such as coffee creamers and sour cream substitutes. Salad dressings that are made of unknown oils, cheese, or sour cream. The items listed above may not be a complete list of foods and beverages to avoid. Contact your dietitian for more information. This information is not intended to replace advice given to you by your health care provider. Make sure you discuss any questions you have with your health care   provider. Document Released: 04/12/2008 Document Revised: 01/22/2016 Document Reviewed: 12/26/2013 Elsevier Interactive Patient Education  2017 [place].   Hypertension Hypertension, commonly called high blood pressure, is when the force of blood pumping through  the arteries is too strong. The arteries are the blood vessels that carry blood from the heart throughout the body. Hypertension forces the heart to work harder to pump blood and may cause arteries to become narrow or stiff. Having untreated or uncontrolled hypertension can cause heart attacks, strokes, kidney disease, and other problems. A blood pressure reading consists of a higher number over a lower number. Ideally, your blood pressure should be below 120/80. The first ("top") number is called the systolic pressure. It is a measure of the pressure in your arteries as your heart beats. The second ("bottom") number is called the diastolic pressure. It is a measure of the pressure in your arteries as the heart relaxes. What are the causes? The cause of this condition is not known. What increases the risk? Some risk factors for high blood pressure are under your control. Others are not. Factors you can change  Smoking.  Having type 2 diabetes mellitus, high cholesterol, or both.  Not getting enough exercise or physical activity.  Being overweight.  Having too much fat, sugar, calories, or salt (sodium) in your diet.  Drinking too much alcohol. Factors that are difficult or impossible to change  Having chronic kidney disease.  Having a family history of high blood pressure.  Age. Risk increases with age.  Race. You may be at higher risk if you are African-American.  Gender. Men are at higher risk than women before age 102. After age 52, women are at higher risk than men.  Having obstructive sleep apnea.  Stress. What are the signs or symptoms? Extremely high blood pressure (hypertensive crisis) may cause:  Headache.  Anxiety.  Shortness of breath.  Nosebleed.  Nausea and vomiting.  Severe chest pain.  Jerky movements you cannot control (seizures).  How is this diagnosed? This condition is diagnosed by measuring your blood pressure while you are seated, with your arm  resting on a surface. The cuff of the blood pressure monitor will be placed directly against the skin of your upper arm at the level of your heart. It should be measured at least twice using the same arm. Certain conditions can cause a difference in blood pressure between your right and left arms. Certain factors can cause blood pressure readings to be lower or higher than normal (elevated) for a short period of time:  When your blood pressure is higher when you are in a health care provider's office than when you are at home, this is called white coat hypertension. Most people with this condition do not need medicines.  When your blood pressure is higher at home than when you are in a health care provider's office, this is called masked hypertension. Most people with this condition may need medicines to control blood pressure.  If you have a high blood pressure reading during one visit or you have normal blood pressure with other risk factors:  You may be asked to return on a different day to have your blood pressure checked again.  You may be asked to monitor your blood pressure at home for 1 week or longer.  If you are diagnosed with hypertension, you may have other blood or imaging tests to help your health care provider understand your overall risk for other conditions. How is this treated? This  condition is treated by making healthy lifestyle changes, such as eating healthy foods, exercising more, and reducing your alcohol intake. Your health care provider may prescribe medicine if lifestyle changes are not enough to get your blood pressure under control, and if:  Your systolic blood pressure is above 130.  Your diastolic blood pressure is above 80.  Your personal target blood pressure may vary depending on your medical conditions, your age, and other factors. Follow these instructions at home: Eating and drinking  Eat a diet that is high in fiber and potassium, and low in sodium,  added sugar, and fat. An example eating plan is called the DASH (Dietary Approaches to Stop Hypertension) diet. To eat this way: ? Eat plenty of fresh fruits and vegetables. Try to fill half of your plate at each meal with fruits and vegetables. ? Eat whole grains, such as whole wheat pasta, brown rice, or whole grain bread. Fill about one quarter of your plate with whole grains. ? Eat or drink low-fat dairy products, such as skim milk or low-fat yogurt. ? Avoid fatty cuts of meat, processed or cured meats, and poultry with skin. Fill about one quarter of your plate with lean proteins, such as fish, chicken without skin, beans, eggs, and tofu. ? Avoid premade and processed foods. These tend to be higher in sodium, added sugar, and fat.  Reduce your daily sodium intake. Most people with hypertension should eat less than 1,500 mg of sodium a day.  Limit alcohol intake to no more than 1 drink a day for nonpregnant women and 2 drinks a day for men. One drink equals 12 oz of beer, 5 oz of wine, or 1 oz of hard liquor. Lifestyle  Work with your health care provider to maintain a healthy body weight or to lose weight. Ask what an ideal weight is for you.  Get at least 30 minutes of exercise that causes your heart to beat faster (aerobic exercise) most days of the week. Activities may include walking, swimming, or biking.  Include exercise to strengthen your muscles (resistance exercise), such as pilates or lifting weights, as part of your weekly exercise routine. Try to do these types of exercises for 30 minutes at least 3 days a week.  Do not use any products that contain nicotine or tobacco, such as cigarettes and e-cigarettes. If you need help quitting, ask your health care provider.  Monitor your blood pressure at home as told by your health care provider.  Keep all follow-up visits as told by your health care provider. This is important. Medicines  Take over-the-counter and prescription  medicines only as told by your health care provider. Follow directions carefully. Blood pressure medicines must be taken as prescribed.  Do not skip doses of blood pressure medicine. Doing this puts you at risk for problems and can make the medicine less effective.  Ask your health care provider about side effects or reactions to medicines that you should watch for. Contact a health care provider if:  You think you are having a reaction to a medicine you are taking.  You have headaches that keep coming back (recurring).  You feel dizzy.  You have swelling in your ankles.  You have trouble with your vision. Get help right away if:  You develop a severe headache or confusion.  You have unusual weakness or numbness.  You feel faint.  You have severe pain in your chest or abdomen.  You vomit repeatedly.  You have trouble breathing.  Summary  Hypertension is when the force of blood pumping through your arteries is too strong. If this condition is not controlled, it may put you at risk for serious complications.  Your personal target blood pressure may vary depending on your medical conditions, your age, and other factors. For most people, a normal blood pressure is less than 120/80.  Hypertension is treated with lifestyle changes, medicines, or a combination of both. Lifestyle changes include weight loss, eating a healthy, low-sodium diet, exercising more, and limiting alcohol. This information is not intended to replace advice given to you by your health care provider. Make sure you discuss any questions you have with your health care provider. Document Released: 07/04/2005 Document Revised: 06/01/2016 Document Reviewed: 06/01/2016 Elsevier Interactive Patient Education  2018 [employer]  Stop Lisinopril (due to cough) and start Losartan 50mg  daily- PLEASE take EVERYday! Increase water intake, strive for at least 125 ounces/day.   Follow Heart Healthy diet Increase regular  exercise.  Recommend at least 30 minutes daily, 5 days per week of walking, jogging, biking, swimming, YouTube/Pinterest workout videos. We will call when you lab results are available. Please check BP daily and bring log to follow-up in 4 weeks. THANK YOU FOR YOUR SON'S SERVICE! NICE TO SEE YOU!

## 2017-05-31 NOTE — Assessment & Plan Note (Signed)
BP well above goal 157/105, HR 93 R/t not taking medication as directed and emotional stress r/t son recently leaving for [employer]. Developed ACE cough over last month- d/c'd Lisinopril, started on Losartan 50mg  daily Instructed to take daily and record BP each day F/u in 4 weeks

## 2017-06-01 LAB — CBC WITH DIFFERENTIAL/PLATELET
BASOS: 1 %
Basophils Absolute: 0.1 10*3/uL (ref 0.0–0.2)
EOS (ABSOLUTE): 0.2 10*3/uL (ref 0.0–0.4)
EOS: 2 %
Hematocrit: 46.2 % (ref 37.5–51.0)
Hemoglobin: 16.8 g/dL (ref 13.0–17.7)
IMMATURE GRANS (ABS): 0.1 10*3/uL (ref 0.0–0.1)
IMMATURE GRANULOCYTES: 1 %
LYMPHS: 25 %
Lymphocytes Absolute: 2.2 10*3/uL (ref 0.7–3.1)
MCH: 31.3 pg (ref 26.6–33.0)
MCHC: 36.4 g/dL — ABNORMAL HIGH (ref 31.5–35.7)
MCV: 86 fL (ref 79–97)
Monocytes Absolute: 0.5 10*3/uL (ref 0.1–0.9)
Monocytes: 6 %
NEUTROS ABS: 5.7 10*3/uL (ref 1.4–7.0)
NEUTROS PCT: 65 %
PLATELETS: 229 10*3/uL (ref 150–379)
RBC: 5.36 x10E6/uL (ref 4.14–5.80)
RDW: 13.6 % (ref 12.3–15.4)
WBC: 8.7 10*3/uL (ref 3.4–10.8)

## 2017-06-01 LAB — LIPID PANEL
CHOLESTEROL TOTAL: 257 mg/dL — AB (ref 100–199)
Chol/HDL Ratio: 8.3 ratio — ABNORMAL HIGH (ref 0.0–5.0)
HDL: 31 mg/dL — ABNORMAL LOW (ref >39)
TRIGLYCERIDES: 844 mg/dL — AB (ref 0–149)

## 2017-06-01 LAB — COMPREHENSIVE METABOLIC PANEL
A/G RATIO: 1.8 (ref 1.2–2.2)
ALT: 32 IU/L (ref 0–44)
AST: 17 IU/L (ref 0–40)
Albumin: 4.6 g/dL (ref 3.5–5.5)
Alkaline Phosphatase: 117 IU/L (ref 39–117)
BUN/Creatinine Ratio: 13 (ref 9–20)
BUN: 13 mg/dL (ref 6–24)
Bilirubin Total: 0.5 mg/dL (ref 0.0–1.2)
CALCIUM: 9.5 mg/dL (ref 8.7–10.2)
CO2: 26 mmol/L (ref 20–29)
CREATININE: 0.97 mg/dL (ref 0.76–1.27)
Chloride: 100 mmol/L (ref 96–106)
GFR, EST AFRICAN AMERICAN: 109 mL/min/{1.73_m2} (ref >59)
GFR, EST NON AFRICAN AMERICAN: 94 mL/min/{1.73_m2} (ref >59)
Globulin, Total: 2.6 g/dL (ref 1.5–4.5)
Glucose: 112 mg/dL — ABNORMAL HIGH (ref 65–99)
Potassium: 4.9 mmol/L (ref 3.5–5.2)
Sodium: 140 mmol/L (ref 134–144)
TOTAL PROTEIN: 7.2 g/dL (ref 6.0–8.5)

## 2017-06-01 LAB — HEMOGLOBIN A1C
ESTIMATED AVERAGE GLUCOSE: 103 mg/dL
Hgb A1c MFr Bld: 5.2 % (ref 4.8–5.6)

## 2017-06-01 LAB — TSH: TSH: 1.76 u[IU]/mL (ref 0.450–4.500)

## 2017-06-01 LAB — VITAMIN D 25 HYDROXY (VIT D DEFICIENCY, FRACTURES): VIT D 25 HYDROXY: 7.5 ng/mL — AB (ref 30.0–100.0)

## 2017-06-02 ENCOUNTER — Other Ambulatory Visit: Payer: Self-pay | Admitting: Adult Health

## 2017-06-02 DIAGNOSIS — F17210 Nicotine dependence, cigarettes, uncomplicated: Secondary | ICD-10-CM | POA: Diagnosis not present

## 2017-06-02 DIAGNOSIS — Z880 Allergy status to penicillin: Secondary | ICD-10-CM | POA: Diagnosis not present

## 2017-06-02 DIAGNOSIS — R42 Dizziness and giddiness: Secondary | ICD-10-CM | POA: Diagnosis not present

## 2017-06-02 DIAGNOSIS — R404 Transient alteration of awareness: Secondary | ICD-10-CM | POA: Diagnosis not present

## 2017-06-02 DIAGNOSIS — R51 Headache: Secondary | ICD-10-CM | POA: Diagnosis not present

## 2017-06-02 DIAGNOSIS — I10 Essential (primary) hypertension: Secondary | ICD-10-CM | POA: Diagnosis not present

## 2017-06-02 DIAGNOSIS — Z79899 Other long term (current) drug therapy: Secondary | ICD-10-CM | POA: Diagnosis not present

## 2017-06-02 DIAGNOSIS — S134XXA Sprain of ligaments of cervical spine, initial encounter: Secondary | ICD-10-CM | POA: Diagnosis not present

## 2017-06-02 MED ORDER — ATORVASTATIN CALCIUM 20 MG PO TABS
10.0000 mg | ORAL_TABLET | Freq: Every day | ORAL | 1 refills | Status: DC
Start: 2017-06-02 — End: 2021-12-21

## 2017-06-02 MED ORDER — VITAMIN D (ERGOCALCIFEROL) 1.25 MG (50000 UNIT) PO CAPS: 50000.0000 [IU] | ORAL_CAPSULE | ORAL | 0 refills | Status: DC

## 2017-06-27 NOTE — Progress Notes (Deleted)
Subjective:    Patient ID: Gregory Melton, male    DOB: Nov 19, 1970, 46 y.o.   MRN: [id number]  HPI: 02/28/17 OV: [name] is here for f/u: HTN and recurrent boils.  He has been compliant on Lisinopril 10mg  daily and has been reducing saturated fat/Na++ in diet.   He denies CP/dyspnea/palpitations/dry-cough. He completed Bactrim for boil infection, however boil is still present.  He denies any drainage/pain at site of boil.  He suffered R hand crush injury 2012 and was treated with splinting and large doses of neuropathy medications.  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  He has never been seen by ID or Dermatology for this condition.    05/31/17 OV: [name] is here for CPE with fasting labs.  He has only been taking his Lisinopril 10mg  every few days and has developed a dry, constant cough that last month.  He drinks 80 oz water a day and has a diet high in saturated fat/sugar/CHO.  He continues to smoke, 4-5 cigarettes/day.  He does not exercise regularly.  He reports being tearful and difficulty sleeping since his 45 year old son left for [employer] 3 days ago.  He is under care of dermatology for recurrent boils. Healthcare maintenance: Colonoscopy-not indicated  06/28/17 OV: [name] is here for f/u: HTN and how he is tolerating Losartan 50mg  daily  Patient Care Team    Relationship Specialty Notifications Start End  Gregory Melton, Gregory D, NP PCP - General Family Medicine  01/31/17     Patient Active Problem List   Diagnosis Date Noted  . Abscess 01/31/2017  . HTN (hypertension) 01/31/2017  . Boil of buttock 01/31/2017  . Healthcare maintenance 01/31/2017     No past medical history on file.   Past Surgical History:  Procedure Laterality Date  . ADENOIDECTOMY    . KNEE ARTHROSCOPY Right      Family History  Problem Relation Age of Onset  . Diabetes Maternal Grandmother      Social History   Substance  and Sexual Activity  Drug Use No     Social History   Substance and Sexual Activity  Alcohol Use Yes  . Alcohol/week: 3.6 oz  . Types: 6 Cans of beer per week     Social History   Tobacco Use  Smoking Status Current Every Day Smoker  . Packs/day: 0.50  . Types: Cigarettes  . Start date: 07/19/1995  Smokeless Tobacco Never Used     Outpatient Encounter Medications as of 06/28/2017  Medication Sig  . atorvastatin (LIPITOR) 20 MG tablet Take 0.5 tablets (10 mg total) daily by mouth.  . clindamycin (CLEOCIN) 300 MG capsule Take 300 mg 2 (two) times daily by mouth.  Marland Kitchen. ibuprofen (ADVIL,MOTRIN) 200 MG tablet Take 200 mg by mouth as needed.  Marland Kitchen. losartan (COZAAR) 50 MG tablet Take 1 tablet (50 mg total) daily by mouth.  . Vitamin Melton, Ergocalciferol, (DRISDOL) 50000 units CAPS capsule Take 1 capsule (50,000 Units total) every 7 (seven) days by mouth.   No facility-administered encounter medications on file as of 06/28/2017.     Allergies: Penicillins  There is no height or weight on file to calculate BMI.  There were no vitals taken for this visit.    Review of Systems  Constitutional: Positive for fatigue. Negative for activity change, appetite change, chills, diaphoresis, fever and unexpected weight change.  HENT: Positive for congestion.   Eyes:  Negative for visual disturbance.  Respiratory: Negative for cough, chest tightness, shortness of breath, wheezing and stridor.   Cardiovascular: Negative for chest pain, palpitations and leg swelling.  Gastrointestinal: Negative for abdominal distention, anal bleeding, blood in stool, constipation, diarrhea, nausea and vomiting.  Endocrine: Negative for cold intolerance, heat intolerance, polydipsia, polyphagia and polyuria.  Genitourinary: Negative for difficulty urinating, flank pain and hematuria.  Musculoskeletal: Negative for arthralgias, back pain, joint swelling, myalgias, neck pain and neck stiffness.  Skin: Negative for  color change, pallor, rash and wound.  Neurological: Negative for dizziness, weakness and headaches.  Hematological: Does not bruise/bleed easily.  Psychiatric/Behavioral: Positive for dysphoric mood and sleep disturbance. Negative for decreased concentration, self-injury and suicidal ideas. The patient is not nervous/anxious and is not hyperactive.        Objective:   Physical Exam  Constitutional: He is oriented to person, place, and time. He appears well-developed and well-nourished. No distress.  HENT:  Head: Normocephalic and atraumatic.  Right Ear: Hearing, tympanic membrane, external ear and ear canal normal. Tympanic membrane is not perforated and not bulging. No decreased hearing is noted.  Left Ear: Hearing, tympanic membrane, external ear and ear canal normal. Tympanic membrane is not perforated and not bulging. No decreased hearing is noted.  Nose: Mucosal edema present. Right sinus exhibits no maxillary sinus tenderness and no frontal sinus tenderness. Left sinus exhibits no maxillary sinus tenderness and no frontal sinus tenderness.  Mouth/Throat: Uvula is midline, oropharynx is clear and moist and mucous membranes are normal. Abnormal dentition. Dental caries present.  Eyes: Conjunctivae are normal. Pupils are equal, round, and reactive to light.  Neck: Normal range of motion. Neck supple.  Cardiovascular: Normal rate, regular rhythm, normal heart sounds and intact distal pulses.  No murmur heard. Pulmonary/Chest: Effort normal and breath sounds normal. No respiratory distress. He has no wheezes. He has no rales. He exhibits no tenderness.  Abdominal: Soft. Bowel sounds are normal. He exhibits no distension and no mass. There is no tenderness. There is no rebound and no guarding.  Protuberant abdomen  Genitourinary: Testes normal. Rectal exam shows guaiac negative stool.  Genitourinary Comments: Unable to feel prostate due to size Chaperone present during examination.   Musculoskeletal: Normal range of motion. He exhibits no edema or tenderness.  Lymphadenopathy:    He has no cervical adenopathy.  Neurological: He is alert and oriented to person, place, and time. Coordination normal.  Skin: Skin is warm and dry. No rash noted. He is not diaphoretic. No erythema. No pallor.  Psychiatric: He has a normal mood and affect. His behavior is normal. Judgment and thought content normal.  Nursing note and vitals reviewed.         Assessment & Plan:   No diagnosis found.  No problem-specific Assessment & Plan notes found for this encounter.    FOLLOW-UP:  No Follow-up on file. No diagnosis found.

## 2017-06-28 ENCOUNTER — Ambulatory Visit: Payer: BLUE CROSS/BLUE SHIELD | Admitting: Adult Health

## 2018-04-30 DIAGNOSIS — H6002 Abscess of left external ear: Secondary | ICD-10-CM | POA: Diagnosis not present

## 2018-10-01 DIAGNOSIS — S39012A Strain of muscle, fascia and tendon of lower back, initial encounter: Secondary | ICD-10-CM | POA: Diagnosis not present

## 2021-12-21 ENCOUNTER — Emergency Department: Payer: Self-pay

## 2021-12-21 ENCOUNTER — Other Ambulatory Visit: Payer: Self-pay

## 2021-12-21 ENCOUNTER — Emergency Department
Admission: EM | Admit: 2021-12-21 | Discharge: 2021-12-21 | Disposition: A | Discharge status: Home / Self Care | Payer: Self-pay | Attending: Emergency Medicine | Admitting: Emergency Medicine

## 2021-12-21 ENCOUNTER — Encounter: Payer: Self-pay | Admitting: *Deleted

## 2021-12-21 DIAGNOSIS — I10 Essential (primary) hypertension: Secondary | ICD-10-CM | POA: Insufficient documentation

## 2021-12-21 DIAGNOSIS — R1033 Periumbilical pain: Secondary | ICD-10-CM

## 2021-12-21 DIAGNOSIS — K429 Umbilical hernia without obstruction or gangrene: Secondary | ICD-10-CM | POA: Insufficient documentation

## 2021-12-21 LAB — COMPREHENSIVE METABOLIC PANEL
ALT: 29 U/L (ref 0–44)
AST: 23 U/L (ref 15–41)
Albumin: 4.4 g/dL (ref 3.5–5.0)
Alkaline Phosphatase: 107 U/L (ref 38–126)
Anion gap: 6 (ref 5–15)
BUN: 14 mg/dL (ref 6–20)
CO2: 27 mmol/L (ref 22–32)
Calcium: 9.5 mg/dL (ref 8.9–10.3)
Chloride: 104 mmol/L (ref 98–111)
Creatinine, Ser: 1.07 mg/dL (ref 0.61–1.24)
GFR, Estimated: >60 mL/min (ref >60)
Glucose, Bld: 93 mg/dL (ref 70–99)
Potassium: 4.3 mmol/L (ref 3.5–5.1)
Sodium: 137 mmol/L (ref 135–145)
Total Bilirubin: 0.9 mg/dL (ref 0.3–1.2)
Total Protein: 7.9 g/dL (ref 6.5–8.1)

## 2021-12-21 LAB — CBC
HCT: 46.7 % (ref 39.0–52.0)
Hemoglobin: 16.4 g/dL (ref 13.0–17.0)
MCH: 30 pg (ref 26.0–34.0)
MCHC: 35.1 g/dL (ref 30.0–36.0)
MCV: 85.5 fL (ref 80.0–100.0)
Platelets: 244 10*3/uL (ref 150–400)
RBC: 5.46 MIL/uL (ref 4.22–5.81)
RDW: 12 % (ref 11.5–15.5)
WBC: 10.6 10*3/uL — ABNORMAL HIGH (ref 4.0–10.5)
nRBC: 0 % (ref 0.0–0.2)

## 2021-12-21 LAB — URINALYSIS, ROUTINE W REFLEX MICROSCOPIC
Bacteria, UA: NONE SEEN
Bilirubin Urine: NEGATIVE
Glucose, UA: NEGATIVE mg/dL
Hgb urine dipstick: NEGATIVE
Ketones, ur: 20 mg/dL — AB
Leukocytes,Ua: NEGATIVE
Nitrite: NEGATIVE
Protein, ur: 30 mg/dL — AB
Specific Gravity, Urine: 1.032 — ABNORMAL HIGH (ref 1.005–1.030)
pH: 5 (ref 5.0–8.0)

## 2021-12-21 LAB — LIPASE, BLOOD: Lipase: 25 U/L (ref 11–51)

## 2021-12-21 MED ORDER — LACTATED RINGERS IV BOLUS
1000.0000 mL | Freq: Once | INTRAVENOUS | Status: AC
  Administered 2021-12-21: 1000 mL via INTRAVENOUS

## 2021-12-21 MED ORDER — SUCRALFATE 1 G PO TABS
1.0000 g | ORAL_TABLET | Freq: Once | ORAL | Status: AC
  Administered 2021-12-21: 1 g via ORAL
  Filled 2021-12-21: qty 1

## 2021-12-21 MED ORDER — FAMOTIDINE 20 MG PO TABS: 20.0000 mg | ORAL_TABLET | Freq: Two times a day (BID) | ORAL | 0 refills | Status: AC

## 2021-12-21 MED ORDER — FAMOTIDINE 20 MG PO TABS
40.0000 mg | ORAL_TABLET | Freq: Once | ORAL | Status: AC
  Administered 2021-12-21: 40 mg via ORAL
  Filled 2021-12-21: qty 2

## 2021-12-21 MED ORDER — METOCLOPRAMIDE HCL 10 MG PO TABS: 10.0000 mg | ORAL_TABLET | Freq: Four times a day (QID) | ORAL | 0 refills | Status: DC | PRN

## 2021-12-21 MED ORDER — IOHEXOL 350 MG/ML SOLN
100.0000 mL | Freq: Once | INTRAVENOUS | Status: AC | PRN
  Administered 2021-12-21: 100 mL via INTRAVENOUS

## 2021-12-21 MED ORDER — METOCLOPRAMIDE HCL 10 MG PO TABS
10.0000 mg | ORAL_TABLET | Freq: Once | ORAL | Status: AC
  Administered 2021-12-21: 10 mg via ORAL
  Filled 2021-12-21: qty 1

## 2021-12-21 MED ORDER — SUCRALFATE 1 G PO TABS: 1.0000 g | ORAL_TABLET | Freq: Four times a day (QID) | ORAL | 1 refills | Status: AC

## 2021-12-21 MED ORDER — PANTOPRAZOLE SODIUM 40 MG IV SOLR
40.0000 mg | Freq: Once | INTRAVENOUS | Status: AC
  Administered 2021-12-21: 40 mg via INTRAVENOUS
  Filled 2021-12-21: qty 10

## 2021-12-21 NOTE — ED Provider Notes (Signed)
[facility] Provider Note    Event Date/Time   First MD Initiated Contact with Patient 12/21/21 1845     (approximate)   History   Chief Complaint: Abdominal Pain   HPI  Gregory Melton is a 51 y.o. male with a past history of hypertension, obesity who comes ED complaining of upper abdominal pain for the past 10 days.  Worse in the morning when waking up.  Alleviated by taking Tums.  Nonradiating.  No vomiting or diarrhea.  No black or bloody stool.  No fever. Patient reports he has been taking ibuprofen and Tylenol daily for years due to chronic musculoskeletal pain.  No recent steroids.  He gets full and has discomfort within a few hours after eating.  This is caused him to decrease his oral intake      Physical Exam   Triage Vital Signs: ED Triage Vitals  Enc Vitals Group     BP 12/21/21 1929 (!) 153/88     Pulse Rate 12/21/21 1929 83     Resp 12/21/21 1929 17     Temp 12/21/21 1929 (!) 97 F (36.1 C)     Temp src --      SpO2 12/21/21 1929 99 %     Weight 12/21/21 1743 243 lb (110.2 kg)     Height 12/21/21 1743 5\' 8"  (1.727 m)     Head Circumference --      Peak Flow --      Pain Score 12/21/21 1743 7     Pain Loc --      Pain Edu? --      Excl. in GC? --     Most recent vital signs: Vitals:   12/21/21 1929  BP: (!) 153/88  Pulse: 83  Resp: 17  Temp: (!) 97 F (36.1 C)  SpO2: 99%    General: Awake, no distress.  CV:  Good peripheral perfusion.  Resp:  Normal effort.  Abd:  No distention.  Soft with diffuse upper abdominal tenderness. Other:  No lower extremity edema.  Dry mucous membranes, no rash   ED Results / Procedures / Treatments   Labs (all labs ordered are listed, but only abnormal results are displayed) Labs Reviewed  CBC - Abnormal; Notable for the following components:      Result Value   WBC 10.6 (*)    All other components within normal limits  URINALYSIS, ROUTINE W REFLEX MICROSCOPIC - Abnormal; Notable  for the following components:   Color, Urine YELLOW (*)    APPearance HAZY (*)    Specific Gravity, Urine 1.032 (*)    Ketones, ur 20 (*)    Protein, ur 30 (*)    Non Squamous Epithelial PRESENT (*)    All other components within normal limits  LIPASE, BLOOD  COMPREHENSIVE METABOLIC PANEL     EKG    RADIOLOGY Ultrasound abdomen reviewed and interpreted by me, appears normal.  No AAA, no cholecystitis, no hydronephrosis.  Radiology report reviewed.  CT abdomen pelvis negative   PROCEDURES:  Procedures   MEDICATIONS ORDERED IN ED: Medications  sucralfate (CARAFATE) tablet 1 g (1 g Oral Given 12/21/21 1926)  metoCLOPramide (REGLAN) tablet 10 mg (10 mg Oral Given 12/21/21 1926)  famotidine (PEPCID) tablet 40 mg (40 mg Oral Given 12/21/21 1925)  pantoprazole (PROTONIX) injection 40 mg (40 mg Intravenous Given 12/21/21 2203)  lactated ringers bolus 1,000 mL (1,000 mLs Intravenous New Bag/Given 12/21/21 2135)  iohexol (OMNIPAQUE) 350 MG/ML injection  100 mL (100 mLs Intravenous Contrast Given 12/21/21 2146)     IMPRESSION / MDM / ASSESSMENT AND PLAN / ED COURSE  I reviewed the triage vital signs and the nursing notes.                              Differential diagnosis includes, but is not limited to, cholecystitis, choledocholithiasis, pancreatitis, gastritis, AAA. Low suspicion of dissection, mesenteric ischemia, or organ infarction.  Patient's presentation is most consistent with acute presentation with potential threat to life or bodily function.  Patient presents with upper abdominal pain over the last 10 days.  Symptomatology most consistent with gastritis due to chronic NSAID use.  Labs vital signs and ultrasound are all unremarkable.  On reassessment, he is not having any improvement in symptoms from taking Reglan Pepcid and Carafate so I will obtain a CT scan and give IV Protonix and fluids for hydration.   ----------------------------------------- 11:19 PM on  12/21/2021 ----------------------------------------- CT a/p negative. Sx now localizing to umbilical hernia, which is small and less than 1 cm in diameter. Soft and easily reducible, nontender. Pt counseled on hernia precautions, f/u gen surg. F/u GI for chronic nsaid use and suspected gastritis, continue antacid regimen.      FINAL CLINICAL IMPRESSION(S) / ED DIAGNOSES   Final diagnoses:  Periumbilical abdominal pain  Umbilical hernia without obstruction and without gangrene     Rx / DC Orders   ED Discharge Orders          Ordered    sucralfate (CARAFATE) 1 g tablet  4 times daily        12/21/21 2318    famotidine (PEPCID) 20 MG tablet  2 times daily        12/21/21 2318    metoCLOPramide (REGLAN) 10 MG tablet  Every 6 hours PRN        12/21/21 2318             Note:  This document was prepared using Dragon voice recognition software and may include unintentional dictation errors.   Sharman Cheek, MD 12/21/21 2320

## 2021-12-21 NOTE — ED Triage Notes (Signed)
Pt has abd pain for 1.5 weeks.  Pt has burning sensation in stomach.  No v/d. No blood in stools.  No urinary sx.  Pt sent [facility] for eval.  Pt alert  speech clear.

## 2021-12-21 NOTE — ED Notes (Signed)
Unable to void at this time.

## 2022-01-03 ENCOUNTER — Encounter: Payer: Self-pay | Admitting: Surgery

## 2022-01-03 ENCOUNTER — Encounter: Payer: Self-pay | Admitting: Gastroenterology

## 2022-01-03 ENCOUNTER — Ambulatory Visit (INDEPENDENT_AMBULATORY_CARE_PROVIDER_SITE_OTHER): Payer: Self-pay | Admitting: Surgery

## 2022-01-03 VITALS — BP 162/112 | HR 96 | Temp 97.9°F | Ht 68.0 in | Wt 244.8 lb

## 2022-01-03 DIAGNOSIS — K429 Umbilical hernia without obstruction or gangrene: Secondary | ICD-10-CM

## 2022-01-03 NOTE — Patient Instructions (Addendum)
Our surgery scheduler Barbara will call you within 24-48 hours to get you scheduled. If you have not heard from her after 48 hours, please call our office. Have the blue sheet available when she calls to write down important information.   If you have any concerns or questions, please feel free to call our office.   Umbilical Hernia, Adult  A hernia is a bulge of tissue that pushes through an opening between muscles. An umbilical hernia happens in the abdomen, near the belly button (umbilicus). The hernia may contain tissues from the small intestine, large intestine, or fatty tissue covering the intestines. Umbilical hernias in adults tend to get worse over time, and they require surgical treatment. There are different types of umbilical hernias, including: Indirect hernia. This type is located just above or below the umbilicus. It is the most common type of umbilical hernia in adults. Direct hernia. This type forms through an opening formed by the umbilicus. Reducible hernia. This type of hernia comes and goes. It may be visible only when you strain, lift something heavy, or cough. This type of hernia can be pushed back into the abdomen (reduced). Incarcerated hernia. This type traps abdominal tissue inside the hernia. This type of hernia cannot be reduced. Strangulated hernia. This type of hernia cuts off blood flow to the tissues inside the hernia. The tissues can start to die if this happens. This type of hernia requires emergency treatment. What are the causes? An umbilical hernia happens when tissue inside the abdomen presses on a weak area of the abdominal muscles. What increases the risk? You may have a greater risk of this condition if you: Are obese. Have had several pregnancies. Have a buildup of fluid inside your abdomen. Have had surgery that weakens the abdominal muscles. What are the signs or symptoms? The main symptom of this condition is a painless bulge at or near the belly  button. A reducible hernia may be visible only when you strain, lift something heavy, or cough. Other symptoms may include: Dull pain. A feeling of pressure. Symptoms of a strangulated hernia may include: Pain that gets increasingly worse. Nausea and vomiting. Pain when pressing on the hernia. Skin over the hernia becoming red or purple. Constipation. Blood in the stool. How is this diagnosed? This condition may be diagnosed based on: A physical exam. You may be asked to cough or strain while standing. These actions increase the pressure inside your abdomen and can force the hernia through the opening in your muscles. Your health care provider may try to reduce the hernia by pressing on it. Your symptoms and medical history. How is this treated? Surgery is the only treatment for an umbilical hernia. Surgery for a strangulated hernia is done as soon as possible. If you have a small hernia that is not incarcerated, you may need to lose weight before having surgery. Follow these instructions at home: Lose weight, if told by your health care provider. Do not try to push the hernia back in. Watch your hernia for any changes in color or size. Tell your health care provider if any changes occur. You may need to avoid activities that increase pressure on your hernia. Do not lift anything that is heavier than 10 lb (4.5 kg), or the limit that you are told, until your health care provider says that it is safe. Take over-the-counter and prescription medicines only as told by your health care provider. Keep all follow-up visits. This is important. Contact a health care   provider if: Your hernia gets larger. Your hernia becomes painful. Get help right away if: You develop sudden, severe pain near the area of your hernia. You have pain as well as nausea or vomiting. You have pain and the skin over your hernia changes color. You develop a fever or chills. Summary A hernia is a bulge of tissue that  pushes through an opening between muscles. An umbilical hernia happens near the belly button. Surgery is the only treatment for an umbilical hernia. Do not try to push your hernia back in. Keep all follow-up visits. This is important. This information is not intended to replace advice given to you by your health care provider. Make sure you discuss any questions you have with your health care provider. Document Revised: 02/10/2020 Document Reviewed: 02/10/2020 Elsevier Patient Education  2023 [employer] Inc[employer]  

## 2022-01-04 ENCOUNTER — Telehealth: Payer: Self-pay | Admitting: Surgery

## 2022-01-04 NOTE — Telephone Encounter (Signed)
Pt has been advised of Pre-Admission date/time, COVID Testing date and Surgery date.  Surgery Date: 01/11/22 Preadmission Testing Date: 01/06/22 (phone 1p-5p) Covid Testing Date: Not needed.    Patient has been made aware to call [phone], between 1-3:00pm the day before surgery, to find out what time to arrive for surgery.

## 2022-01-05 ENCOUNTER — Encounter: Payer: Self-pay | Admitting: Surgery

## 2022-01-05 NOTE — Progress Notes (Signed)
Patient ID: Gregory Melton, male   DOB: Jul 22, 1970, 51 y.o.   MRN: [id number]  HPI BURHAN BARHAM is a 51 y.o. male seen in consultation at the request of Dr. Selina Cooley for.  He reports subacute abdominal pain that started several weeks ago.  The pain seems to be in 2 type of pain 1 is located within the abdominal wall around the umbilical hernia defect.  This is sharp intermittent and worsening with Valsalva and certain activities.  He also had some nonspecific upper abdominal pain that is colicky type and seems to be worsening with certain meals.  He recently underwent an ER visit and appropriate work-up to include a CT scan of the abdomen pelvis that I personally reviewed showing evidence of umbilical hernia.  He does not have some fatty infiltration or his liver and slightly enlarged spleen ( not impressive).  There is no evidence of ascites and there is no evidence of portal hypertension or cirrhosis. He also reports weak he does have a significant anxiety as well. He also has been involved in prior back injuries and does have some chronic pain.  He takes NSAIDs and Tylenol routine basis.  He did have recent lab work including a CBC and CMP that was completely normal.  HPI  History reviewed. No pertinent past medical history.  Past Surgical History:  Procedure Laterality Date   ADENOIDECTOMY     KNEE ARTHROSCOPY Right     Family History  Problem Relation Age of Onset   Diabetes Maternal Grandmother     Social History Social History   Tobacco Use   Smoking status: Every Day    Packs/day: 0.50    Types: Cigarettes    Start date: 07/19/1995   Smokeless tobacco: Never  Vaping Use   Vaping Use: Never used  Substance Use Topics   Alcohol use: Yes    Alcohol/week: 6.0 standard drinks of alcohol    Types: 6 Cans of beer per week   Drug use: No    Allergies  Allergen Reactions   Penicillins     Current Outpatient Medications  Medication Sig Dispense Refill   famotidine (PEPCID) 20  MG tablet Take 1 tablet (20 mg total) by mouth 2 (two) times daily. 60 tablet 0   losartan (COZAAR) 25 MG tablet Take 25 mg by mouth daily.     sucralfate (CARAFATE) 1 g tablet Take 1 tablet (1 g total) by mouth 4 (four) times daily. 120 tablet 1   metoCLOPramide (REGLAN) 10 MG tablet Take 1 tablet (10 mg total) by mouth every 6 (six) hours as needed. (Patient not taking: Reported on 01/03/2022) 30 tablet 0   No current facility-administered medications for this visit.     Review of Systems Full ROS  was asked and was negative except for the information on the HPI  Physical Exam Blood pressure (!) 162/112, pulse 96, temperature 97.9 F (36.6 C), temperature source Oral, height 5\' 8"  (1.727 m), weight 244 lb 12.8 oz (111 kg), SpO2 97 %. CONSTITUTIONAL: NAD, anxious. EYES: Pupils are equal, round, , Sclera are non-icteric. EARS, NOSE, MOUTH AND THROAT: The oropharynx is clear. The oral mucosa is pink and moist. Hearing is intact to voice. LYMPH NODES:  Lymph nodes in the neck are normal. RESPIRATORY:  Lungs are clear. There is normal respiratory effort, with equal breath sounds bilaterally, and without pathologic use of accessory muscles. CARDIOVASCULAR: Heart is regular without murmurs, gallops, or rubs. GI: The abdomen is  soft, 3 cm  umbilical hernia. There are no palpable masses. There is no hepatosplenomegaly. There are normal bowel sounds in all quadrants. GU: Rectal deferred.   MUSCULOSKELETAL: Normal muscle strength and tone. No cyanosis or edema.   SKIN: Turgor is good and there are no pathologic skin lesions or ulcers. NEUROLOGIC: Motor and sensation is grossly normal. Cranial nerves are grossly intact. PSYCH:  Oriented to person, place and time. Affect is normal.  Data Reviewed  I have personally reviewed the patient's imaging, laboratory findings and medical records.    Assessment/Plan.   51 year old male with past potential for multiple etiologies.  He definitely does have  abdominal wall pain related to the umbilical hernia.  I was very candid with him and told him that some of his functional pain will not improve with a hernia operation.  However he does have other symptoms that are clear-cut related to the ventral hernia defect.  I had an extensive discussion with the patient about options to wait for further endoscopic and GI work-up versus proceeding more promptly further with an operation.  He was very deliberate in his decision and he chooses to go ahead and have his hernia fixed.  I do think that we can perform this in an open fashion very effectively and will require mesh.  Procedure discussed with the patient in detail.  Risks, benefits and possible complications including but not limited to: Bleeding, infection, injury to adjacent structures, chronic pain.  I also was very candid that some of the symptoms that are functional will not resolve with repair of hernia.  He is fully aware of this.  Please note that I spent greater than 60 minutes in this encounter including coordination of his care, placing orders,  , providing extensive counseling and performing appropriate documentation  Sterling Big, MD FACS General Surgeon 01/05/2022, 6:39 PM

## 2022-01-06 ENCOUNTER — Encounter
Admission: RE | Admit: 2022-01-06 | Discharge: 2022-01-06 | Disposition: A | Discharge status: Home / Self Care | Payer: Self-pay | Source: Ambulatory Visit | Attending: Surgery | Admitting: Surgery

## 2022-01-06 DIAGNOSIS — I10 Essential (primary) hypertension: Secondary | ICD-10-CM

## 2022-01-06 DIAGNOSIS — Z01818 Encounter for other preprocedural examination: Secondary | ICD-10-CM

## 2022-01-06 HISTORY — DX: Unspecified osteoarthritis, unspecified site: M19.90

## 2022-01-06 HISTORY — DX: Gastro-esophageal reflux disease without esophagitis: K21.9

## 2022-01-06 HISTORY — DX: Headache, unspecified: R51.9

## 2022-01-06 HISTORY — DX: Essential (primary) hypertension: I10

## 2022-01-06 NOTE — Patient Instructions (Addendum)
Your procedure is scheduled on:01-11-22 Tuesday Report to the Registration Desk on the 1st floor of the [facility].Then proceed to the 2nd floor Surgery Desk To find out your arrival time, please call [phone] between 1PM - 3PM on:01-12-22 Monday If your arrival time is 6:00 am, do not arrive prior to that time as the [facility] entrance doors do not open until 6:00 am.  REMEMBER: Instructions that are not followed completely may result in serious medical risk, up to and including death; or upon the discretion of your surgeon and anesthesiologist your surgery may need to be rescheduled.  Do not eat food after midnight the night before surgery.  No gum chewing, lozengers or hard candies.  You may however, drink CLEAR liquids up to 2 hours before you are scheduled to arrive for your surgery. Do not drink anything within 2 hours of your scheduled arrival time.  Clear liquids include: - water  - apple juice without pulp - gatorade (not RED colors) - black coffee or tea (Do NOT add milk or creamers to the coffee or tea) Do NOT drink anything that is not on this list.  TAKE THESE MEDICATIONS THE MORNING OF SURGERY WITH A SIP OF WATER: -famotidine (PEPCID)  One week prior to surgery: Stop Anti-inflammatories (NSAIDS) such as Advil, Aleve, Ibuprofen, Motrin, Naproxen, Naprosyn and Aspirin based products such as Excedrin, Goodys Powder, BC Powder.You may however, take Tylenol if needed for pain up until the day of surgery. Stop ANY OVER THE COUNTER supplements/vitamins NOW (01-06-22) until after surgery.  No Alcohol for 24 hours before or after surgery.  No Smoking including e-cigarettes for 24 hours prior to surgery.  No chewable tobacco products for at least 6 hours prior to surgery.  No nicotine patches on the day of surgery.  Do not use any "recreational" drugs for at least a week prior to your surgery.  Please be advised that the combination of cocaine and anesthesia may have  negative outcomes, up to and including death. If you test positive for cocaine, your surgery will be cancelled.  On the morning of surgery brush your teeth with toothpaste and water, you may rinse your mouth with mouthwash if you wish. Do not swallow any toothpaste or mouthwash.  Use CHG Soap as directed on instruction sheet.  Do not wear jewelry, make-up, hairpins, clips or nail polish.  Do not wear lotions, powders, or perfumes.   Do not shave body from the neck down 48 hours prior to surgery just in case you cut yourself which could leave a site for infection.  Also, freshly shaved skin may become irritated if using the CHG soap.  Contact lenses, hearing aids and dentures may not be worn into surgery.  Do not bring valuables to the hospital. [facility] is not responsible for any missing/lost belongings or valuables.   Notify your doctor if there is any change in your medical condition (cold, fever, infection).  Wear comfortable clothing (specific to your surgery type) to the hospital.  After surgery, you can help prevent lung complications by doing breathing exercises.  Take deep breaths and cough every 1-2 hours. Your doctor may order a device called an Incentive Spirometer to help you take deep breaths. When coughing or sneezing, hold a pillow firmly against your incision with both hands. This is called "splinting." Doing this helps protect your incision. It also decreases belly discomfort.  If you are being admitted to the hospital overnight, leave your suitcase in the car. After  surgery it may be brought to your room.  If you are being discharged the day of surgery, you will not be allowed to drive home. You will need a responsible adult (18 years or older) to drive you home and stay with you that night.   If you are taking public transportation, you will need to have a responsible adult (18 years or older) with you. Please confirm with your physician that it is acceptable  to use public transportation.   Please call the Pre-admissions Testing Dept. at [phone] if you have any questions about these instructions.  Surgery Visitation Policy:  Patients undergoing a surgery or procedure may have two family members or support persons with them as long as the person is not COVID-19 positive or experiencing its symptoms.

## 2022-01-10 ENCOUNTER — Encounter
Admission: RE | Admit: 2022-01-10 | Discharge: 2022-01-10 | Disposition: A | Discharge status: Home / Self Care | Payer: Self-pay | Source: Ambulatory Visit | Attending: Surgery | Admitting: Surgery

## 2022-01-10 DIAGNOSIS — I10 Essential (primary) hypertension: Secondary | ICD-10-CM | POA: Insufficient documentation

## 2022-01-10 DIAGNOSIS — Z01818 Encounter for other preprocedural examination: Secondary | ICD-10-CM

## 2022-01-10 DIAGNOSIS — Z01810 Encounter for preprocedural cardiovascular examination: Secondary | ICD-10-CM | POA: Insufficient documentation

## 2022-01-11 ENCOUNTER — Encounter
Admission: RE | Disposition: A | Discharge status: Home / Self Care | Payer: Self-pay | Source: Ambulatory Visit | Attending: Surgery

## 2022-01-11 ENCOUNTER — Ambulatory Visit: Payer: Self-pay | Admitting: Anesthesiology

## 2022-01-11 ENCOUNTER — Other Ambulatory Visit: Payer: Self-pay

## 2022-01-11 ENCOUNTER — Ambulatory Visit
Admission: RE | Admit: 2022-01-11 | Discharge: 2022-01-11 | Disposition: A | Discharge status: Home / Self Care | Payer: Self-pay | Source: Ambulatory Visit | Attending: Surgery | Admitting: Surgery

## 2022-01-11 ENCOUNTER — Encounter: Payer: Self-pay | Admitting: Surgery

## 2022-01-11 DIAGNOSIS — F17210 Nicotine dependence, cigarettes, uncomplicated: Secondary | ICD-10-CM | POA: Insufficient documentation

## 2022-01-11 DIAGNOSIS — I10 Essential (primary) hypertension: Secondary | ICD-10-CM | POA: Insufficient documentation

## 2022-01-11 DIAGNOSIS — K219 Gastro-esophageal reflux disease without esophagitis: Secondary | ICD-10-CM | POA: Insufficient documentation

## 2022-01-11 DIAGNOSIS — K429 Umbilical hernia without obstruction or gangrene: Secondary | ICD-10-CM | POA: Insufficient documentation

## 2022-01-11 HISTORY — PX: INSERTION OF MESH: SHX5868

## 2022-01-11 HISTORY — PX: UMBILICAL HERNIA REPAIR: SHX196

## 2022-01-11 SURGERY — REPAIR, HERNIA, UMBILICAL, ADULT
Anesthesia: General | Site: Abdomen

## 2022-01-11 MED ORDER — HYDROCODONE-ACETAMINOPHEN 5-325 MG PO TABS
2.0000 | ORAL_TABLET | Freq: Four times a day (QID) | ORAL | Status: DC | PRN
  Administered 2022-01-11: 2 via ORAL

## 2022-01-11 MED ORDER — MIDAZOLAM HCL 2 MG/2ML IJ SOLN
INTRAMUSCULAR | Status: AC
  Filled 2022-01-11: qty 2

## 2022-01-11 MED ORDER — HYDROCODONE-ACETAMINOPHEN 5-325 MG PO TABS: 1.0000 | ORAL_TABLET | ORAL | 0 refills | Status: AC | PRN

## 2022-01-11 MED ORDER — ROCURONIUM BROMIDE 100 MG/10ML IV SOLN
INTRAVENOUS | Status: DC | PRN
  Administered 2022-01-11: 50 mg via INTRAVENOUS

## 2022-01-11 MED ORDER — ALBUTEROL SULFATE HFA 108 (90 BASE) MCG/ACT IN AERS
INHALATION_SPRAY | RESPIRATORY_TRACT | Status: DC | PRN
  Administered 2022-01-11 (×3): 8 via RESPIRATORY_TRACT

## 2022-01-11 MED ORDER — CHLORHEXIDINE GLUCONATE 0.12 % MT SOLN
15.0000 mL | Freq: Once | OROMUCOSAL | Status: AC
  Administered 2022-01-11: 15 mL via OROMUCOSAL

## 2022-01-11 MED ORDER — GABAPENTIN 300 MG PO CAPS
ORAL_CAPSULE | ORAL | Status: AC
  Administered 2022-01-11: 300 mg via ORAL
  Filled 2022-01-11: qty 1

## 2022-01-11 MED ORDER — LACTATED RINGERS IV SOLN: INTRAVENOUS | Status: DC | PRN

## 2022-01-11 MED ORDER — DEXAMETHASONE SODIUM PHOSPHATE 10 MG/ML IJ SOLN
INTRAMUSCULAR | Status: DC | PRN
  Administered 2022-01-11: 10 mg via INTRAVENOUS

## 2022-01-11 MED ORDER — BUPIVACAINE-EPINEPHRINE (PF) 0.25% -1:200000 IJ SOLN
INTRAMUSCULAR | Status: AC
  Filled 2022-01-11: qty 30

## 2022-01-11 MED ORDER — CEFAZOLIN SODIUM-DEXTROSE 2-3 GM-%(50ML) IV SOLR
INTRAVENOUS | Status: DC | PRN
  Administered 2022-01-11: 2 g via INTRAVENOUS

## 2022-01-11 MED ORDER — PROPOFOL 10 MG/ML IV BOLUS
INTRAVENOUS | Status: DC | PRN
  Administered 2022-01-11: 150 mg via INTRAVENOUS

## 2022-01-11 MED ORDER — ORAL CARE MOUTH RINSE: 15.0000 mL | Freq: Once | OROMUCOSAL | Status: AC

## 2022-01-11 MED ORDER — BUPIVACAINE LIPOSOME 1.3 % IJ SUSP
INTRAMUSCULAR | Status: AC
  Filled 2022-01-11: qty 20

## 2022-01-11 MED ORDER — CHLORHEXIDINE GLUCONATE CLOTH 2 % EX PADS
6.0000 | MEDICATED_PAD | Freq: Once | CUTANEOUS | Status: AC
  Administered 2022-01-11: 6 via TOPICAL

## 2022-01-11 MED ORDER — ONDANSETRON HCL 4 MG/2ML IJ SOLN
INTRAMUSCULAR | Status: DC | PRN
  Administered 2022-01-11: 4 mg via INTRAVENOUS

## 2022-01-11 MED ORDER — MIDAZOLAM HCL 2 MG/2ML IJ SOLN
INTRAMUSCULAR | Status: DC | PRN
  Administered 2022-01-11: 2 mg via INTRAVENOUS

## 2022-01-11 MED ORDER — LACTATED RINGERS IV SOLN: INTRAVENOUS | Status: DC

## 2022-01-11 MED ORDER — FENTANYL CITRATE (PF) 100 MCG/2ML IJ SOLN
INTRAMUSCULAR | Status: AC
  Administered 2022-01-11: 50 ug via INTRAVENOUS
  Filled 2022-01-11: qty 2

## 2022-01-11 MED ORDER — LIDOCAINE HCL (CARDIAC) PF 100 MG/5ML IV SOSY
PREFILLED_SYRINGE | INTRAVENOUS | Status: DC | PRN
  Administered 2022-01-11: 60 mg via INTRAVENOUS

## 2022-01-11 MED ORDER — CEFAZOLIN SODIUM-DEXTROSE 2-4 GM/100ML-% IV SOLN: 2.0000 g | INTRAVENOUS | Status: DC

## 2022-01-11 MED ORDER — FENTANYL CITRATE (PF) 100 MCG/2ML IJ SOLN
INTRAMUSCULAR | Status: DC | PRN
  Administered 2022-01-11 (×2): 50 ug via INTRAVENOUS

## 2022-01-11 MED ORDER — FENTANYL CITRATE (PF) 100 MCG/2ML IJ SOLN
INTRAMUSCULAR | Status: AC
  Filled 2022-01-11: qty 2

## 2022-01-11 MED ORDER — GLYCOPYRROLATE 0.2 MG/ML IJ SOLN
INTRAMUSCULAR | Status: DC | PRN
  Administered 2022-01-11: .2 mg via INTRAVENOUS

## 2022-01-11 MED ORDER — ACETAMINOPHEN 500 MG PO TABS: 1000.0000 mg | ORAL_TABLET | ORAL | Status: AC

## 2022-01-11 MED ORDER — CEFAZOLIN SODIUM-DEXTROSE 2-4 GM/100ML-% IV SOLN
INTRAVENOUS | Status: DC
Start: 2022-01-11 — End: 2022-01-11
  Filled 2022-01-11: qty 100

## 2022-01-11 MED ORDER — GABAPENTIN 300 MG PO CAPS: 300.0000 mg | ORAL_CAPSULE | ORAL | Status: AC

## 2022-01-11 MED ORDER — DEXMEDETOMIDINE HCL IN NACL 200 MCG/50ML IV SOLN
INTRAVENOUS | Status: DC | PRN
  Administered 2022-01-11: 8 ug via INTRAVENOUS

## 2022-01-11 MED ORDER — ACETAMINOPHEN 500 MG PO TABS
ORAL_TABLET | ORAL | Status: AC
  Administered 2022-01-11: 1000 mg via ORAL
  Filled 2022-01-11: qty 2

## 2022-01-11 MED ORDER — FENTANYL CITRATE (PF) 100 MCG/2ML IJ SOLN
25.0000 ug | INTRAMUSCULAR | Status: DC | PRN
  Administered 2022-01-11: 50 ug via INTRAVENOUS

## 2022-01-11 MED ORDER — BUPIVACAINE-EPINEPHRINE (PF) 0.25% -1:200000 IJ SOLN
INTRAMUSCULAR | Status: DC | PRN
  Administered 2022-01-11: 50 mL via RESPIRATORY_TRACT

## 2022-01-11 MED ORDER — HYDROCODONE-ACETAMINOPHEN 5-325 MG PO TABS
ORAL_TABLET | ORAL | Status: AC
  Filled 2022-01-11: qty 2

## 2022-01-11 SURGICAL SUPPLY — 34 items
ADH SKN CLS APL DERMABOND .7 (GAUZE/BANDAGES/DRESSINGS) ×2
APL PRP STRL LF DISP 70% ISPRP (MISCELLANEOUS) ×2
APPLIER CLIP 11 MED OPEN (CLIP) ×3
APR CLP MED 11 20 MLT OPN (CLIP) ×2
BLADE CLIPPER SURG (BLADE) ×3 IMPLANT
BLADE SURG 15 STRL LF DISP TIS (BLADE) ×2 IMPLANT
BLADE SURG 15 STRL SS (BLADE) ×3
CHLORAPREP W/TINT 26 (MISCELLANEOUS) ×3 IMPLANT
CLIP APPLIE 11 MED OPEN (CLIP) ×2 IMPLANT
DERMABOND ADVANCED (GAUZE/BANDAGES/DRESSINGS) ×1
DERMABOND ADVANCED .7 DNX12 (GAUZE/BANDAGES/DRESSINGS) IMPLANT
DRAPE INCISE IOBAN 66X45 STRL (DRAPES) ×3 IMPLANT
DRAPE LAPAROTOMY 77X122 PED (DRAPES) ×3 IMPLANT
DRSG TELFA 3X8 NADH (GAUZE/BANDAGES/DRESSINGS) ×3 IMPLANT
ELECT CAUTERY BLADE 6.4 (BLADE) ×3 IMPLANT
ELECT REM PT RETURN 9FT ADLT (ELECTROSURGICAL) ×3
ELECTRODE REM PT RTRN 9FT ADLT (ELECTROSURGICAL) ×2 IMPLANT
GAUZE 4X4 16PLY ~~LOC~~+RFID DBL (SPONGE) ×3 IMPLANT
GLOVE BIO SURGEON STRL SZ7 (GLOVE) ×3 IMPLANT
GOWN STRL REUS W/ TWL LRG LVL3 (GOWN DISPOSABLE) ×4 IMPLANT
GOWN STRL REUS W/TWL LRG LVL3 (GOWN DISPOSABLE) ×6
MANIFOLD NEPTUNE II (INSTRUMENTS) ×3 IMPLANT
MESH VENTRALEX ST 2.5 CRC MED (Mesh General) ×3 IMPLANT
NEEDLE HYPO 22GX1.5 SAFETY (NEEDLE) ×3 IMPLANT
NS IRRIG 500ML POUR BTL (IV SOLUTION) ×3 IMPLANT
PACK BASIN MINOR ARMC (MISCELLANEOUS) ×3 IMPLANT
PAD DRESSING TELFA 3X8 NADH (GAUZE/BANDAGES/DRESSINGS) ×2 IMPLANT
SPONGE T-LAP 18X18 ~~LOC~~+RFID (SPONGE) ×3 IMPLANT
SUT ETHIBOND NAB MO 7 #0 18IN (SUTURE) ×3 IMPLANT
SUT MNCRL AB 4-0 PS2 18 (SUTURE) ×3 IMPLANT
SUT VIC AB 3-0 SH 27 (SUTURE) ×3
SUT VIC AB 3-0 SH 27X BRD (SUTURE) ×2 IMPLANT
SYR 20ML LL LF (SYRINGE) ×3 IMPLANT
WATER STERILE IRR 500ML POUR (IV SOLUTION) ×3 IMPLANT

## 2022-01-11 NOTE — Anesthesia Preprocedure Evaluation (Signed)
Anesthesia Evaluation  Patient identified by MRN, date of birth, ID band Patient awake    Reviewed: Allergy & Precautions, H&P , NPO status , Patient's Chart, lab work & pertinent test results, reviewed documented beta blocker date and time   History of Anesthesia Complications Negative for: history of anesthetic complications  Airway Mallampati: II  TM Distance: >3 FB Neck ROM: full    Dental  (+) Dental Advidsory Given, Teeth Intact   Pulmonary neg shortness of breath, neg sleep apnea, neg COPD, neg recent URI, Current Smoker and Patient abstained from smoking.,    Pulmonary exam normal breath sounds clear to auscultation       Cardiovascular Exercise Tolerance: Good hypertension, (-) angina(-) Past MI and (-) Cardiac Stents Normal cardiovascular exam(-) dysrhythmias (-) Valvular Problems/Murmurs Rhythm:regular Rate:Normal     Neuro/Psych negative neurological ROS  negative psych ROS   GI/Hepatic Neg liver ROS, GERD  ,  Endo/Other  negative endocrine ROS  Renal/GU negative Renal ROS  negative genitourinary   Musculoskeletal   Abdominal   Peds  Hematology negative hematology ROS (+)   Anesthesia Other Findings Past Medical History: No date: Arthritis     Comment:  right hand and wrist and lower back No date: GERD (gastroesophageal reflux disease) No date: Headache     Comment:  migraines No date: Hypertension   Reproductive/Obstetrics negative OB ROS                             Anesthesia Physical Anesthesia Plan  ASA: 2  Anesthesia Plan: General   Post-op Pain Management:    Induction: Intravenous  PONV Risk Score and Plan: 1 and Ondansetron, Dexamethasone, Midazolam and Treatment may vary due to age or medical condition  Airway Management Planned: Oral ETT and LMA  Additional Equipment:   Intra-op Plan:   Post-operative Plan: Extubation in OR  Informed Consent: I  have reviewed the patients History and Physical, chart, labs and discussed the procedure including the risks, benefits and alternatives for the proposed anesthesia with the patient or authorized representative who has indicated his/her understanding and acceptance.     Dental Advisory Given  Plan Discussed with: Anesthesiologist, CRNA and Surgeon  Anesthesia Plan Comments:         Anesthesia Quick Evaluation

## 2022-01-11 NOTE — Transfer of Care (Addendum)
Immediate Anesthesia Transfer of Care Note  Patient: Gregory Melton  Procedure(s) Performed: HERNIA REPAIR UMBILICAL ADULT (Abdomen)  Patient Location: PACU  Anesthesia Type:General  Level of Consciousness: drowsy  Airway & Oxygen Therapy: Patient Spontanous Breathing and Patient connected to face mask oxygen  Post-op Assessment: Report given to RN and Post -op Vital signs reviewed and stable  Post vital signs: Reviewed and stable  Last Vitals:  Vitals Value Taken Time  BP    Temp    Pulse    Resp    SpO2      Last Pain:  Vitals:   01/11/22 0959  TempSrc: Temporal  PainSc: 4          Complications: No notable events documented.

## 2022-01-11 NOTE — Op Note (Addendum)
Umbilical Hernia Repair with Mesh 6.4 cm Ventralex   Pre-operative Diagnosis: umbilical hernia  Post-operative Diagnosis: same  Surgeon: Sterling Big, MD FACS  Anesthesia: Gen. with endotracheal tube   Findings: 3cm cm UH  Estimated Blood Loss: 5cc                 Specimens: sac          Complications: none              Procedure Details  The patient was seen again in the Holding Room. The benefits, complications, treatment options, and expected outcomes were discussed with the patient. The risks of bleeding, infection, recurrence of symptoms, failure to resolve symptoms, bowel injury, mesh placement, mesh infection, any of which could require further surgery were reviewed with the patient. The likelihood of improving the patient's symptoms with return to their baseline status is good.  The patient and/or family concurred with the proposed plan, giving informed consent.  The patient was taken to Operating Room, identified and the procedure verified.  A Time Out was held and the above information confirmed.  Prior to the induction of general anesthesia, antibiotic prophylaxis was administered. VTE prophylaxis was in place. General endotracheal anesthesia was then administered and tolerated well. After the induction, the abdomen was prepped with Chloraprep and draped in the sterile fashion. The patient was positioned in the supine position.  Incision was created with a scalpel over the hernia defect. Electrocautery was used to dissect through subcutaneous tissue, the hernia sac was opened excised. The hernia was measured.  I placed 6.4 cm mesh underlay fashion and secured it to the fascia with u 0 ethibond stitches in four quadrants.  I closed the hernia defect with interrupted 0 Ethibond sutures.  Liposomal marcaine was placed on both sides of the abdominal wall  Incision was closed in a 2 layer fashion with 3-0 Vicryl and 4-0 Monocryl. Dermabond was used to coat the skin. Marcaine  quarter percent with epinephrine and lidocaine 1% was used to inject all the incision sites. Patient tolerated procedure well and there were no immediate complications. Needle and laparotomy counts were correct   Sterling Big, MD, FACS

## 2022-01-12 ENCOUNTER — Encounter: Payer: Self-pay | Admitting: Surgery

## 2022-01-12 LAB — SURGICAL PATHOLOGY

## 2022-01-12 NOTE — Anesthesia Postprocedure Evaluation (Signed)
Anesthesia Post Note  Patient: Gregory Melton  Procedure(s) Performed: HERNIA REPAIR UMBILICAL ADULT (Abdomen) INSERTION OF MESH (Abdomen)  Patient location during evaluation: PACU Anesthesia Type: General Level of consciousness: awake and alert Pain management: pain level controlled Vital Signs Assessment: post-procedure vital signs reviewed and stable Respiratory status: spontaneous breathing, nonlabored ventilation, respiratory function stable and patient connected to nasal cannula oxygen Cardiovascular status: blood pressure returned to baseline and stable Postop Assessment: no apparent nausea or vomiting Anesthetic complications: no   No notable events documented.   Last Vitals:  Vitals:   01/11/22 1215 01/11/22 1220  BP: (!) 141/94 (!) 144/101  Pulse: 98 95  Resp: 15 18  Temp: 36.6 C (!) 36.1 C  SpO2: 94% 94%    Last Pain:  Vitals:   01/11/22 1220  TempSrc: Temporal  PainSc: 0-No pain                 Lenard Simmer
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
## 2022-01-13 ENCOUNTER — Telehealth: Payer: Self-pay | Admitting: *Deleted

## 2022-01-13 NOTE — Telephone Encounter (Signed)
Faxed FMLA to [employer] at [phone] 

## 2022-01-25 ENCOUNTER — Ambulatory Visit: Payer: Self-pay | Admitting: Gastroenterology

## 2022-01-31 ENCOUNTER — Encounter: Payer: Self-pay | Admitting: Surgery

## 2022-01-31 ENCOUNTER — Ambulatory Visit (INDEPENDENT_AMBULATORY_CARE_PROVIDER_SITE_OTHER): Payer: Self-pay | Admitting: Surgery

## 2022-01-31 VITALS — BP 150/95 | HR 96 | Temp 98.5°F | Ht 68.0 in | Wt 249.0 lb

## 2022-01-31 DIAGNOSIS — Z09 Encounter for follow-up examination after completed treatment for conditions other than malignant neoplasm: Secondary | ICD-10-CM

## 2022-01-31 DIAGNOSIS — K429 Umbilical hernia without obstruction or gangrene: Secondary | ICD-10-CM

## 2022-01-31 NOTE — Progress Notes (Signed)
Surgical Consultation  01/31/2022  Gregory Melton is an 51 y.o. male.   Chief Complaint  Patient presents with   Routine Post Op     HPI: 51 year old male status post repair of umbilical hernia with mesh.  Well.  No fevers or chills some discomfort that is expected.  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   GERD (gastroesophageal reflux disease)    Headache    migraines   Hypertension     Past Surgical History:  Procedure Laterality Date   ADENOIDECTOMY     as a child   INSERTION OF MESH  01/11/2022   Procedure: INSERTION OF MESH;  Surgeon: Leafy Ro, MD;  Location: [facility] ORS;  Service: General;;  umbilical   KNEE ARTHROSCOPY Right    early 30's-meniscus tear   UMBILICAL HERNIA REPAIR N/A 01/11/2022   Procedure: HERNIA REPAIR UMBILICAL ADULT;  Surgeon: Leafy Ro, MD;  Location: [facility] ORS;  Service: General;  Laterality: N/A;    Family History  Problem Relation Age of Onset   Diabetes Maternal Grandmother     Social History:  reports that he has been smoking cigarettes. He started smoking about 26 years ago. He has a 10.00 pack-year smoking history. He has never used smokeless tobacco. He reports current alcohol use of about 6.0 standard drinks of alcohol per week. He reports that he does not use drugs.  Allergies:  Allergies  Allergen Reactions   Penicillins     Pt unsure of reaction    Medications reviewed.     ROS Full ROS performed and is otherwise negative other than what is stated in the HPI    BP (!) 150/95   Pulse 96   Temp 98.5 F (36.9 C)   Ht 5\' 8"  (1.727 m)   Wt 249 lb (112.9 kg)   SpO2 96%   BMI 37.86 kg/m   Physical Exam Vitals and nursing note reviewed. Exam conducted with a chaperone present.  Constitutional:      General: He is not in acute distress.    Appearance: Normal appearance.   Cardiovascular:     Rate and Rhythm: Normal rate and regular rhythm.     Heart sounds: No murmur heard. Pulmonary:     Effort: Pulmonary effort is normal. No respiratory distress.     Breath sounds: Normal breath sounds. No stridor. No wheezing.  Abdominal:     General: Abdomen is flat. There is no distension.     Palpations: Abdomen is soft. There is no mass.     Tenderness: There is no abdominal tenderness. There is no guarding or rebound.     Hernia: No hernia is present.     Comments: Incision healing well without infection, no recurrence  Musculoskeletal:        General: No swelling or tenderness. Normal range of motion.     Cervical back: Normal range of motion and neck supple. No rigidity.  Skin:    General: Skin is warm and dry.     Capillary Refill: Capillary refill takes less than 2 seconds.  Neurological:     General: No focal deficit present.     Mental Status: He is alert and oriented to person, place, and time.  Psychiatric:        Mood and Affect: Mood normal.  Behavior: Behavior normal.        Thought Content: Thought content normal.        Judgment: Judgment normal.      Assessment/Plan: Doing very well after hernia repair.  Expected postoperative course.  No heavy lifting for the full 6 weeks.  Work excuse to be done by my CMA. RTC as needed I SPENT  20 in this encounter including coordination of his care, reviewing records performing appropriate mentation  Sterling Big, MD [facility] General Surgeon

## 2022-01-31 NOTE — Patient Instructions (Addendum)
If you have any concerns or questions, please feel free to call our office. Follow up as needed.    GENERAL POST-OPERATIVE PATIENT INSTRUCTIONS   WOUND CARE INSTRUCTIONS:  Keep a dry clean dressing on the wound if there is drainage. The initial bandage may be removed after 24 hours.  Once the wound has quit draining you may leave it open to air.  If clothing rubs against the wound or causes irritation and the wound is not draining you may cover it with a dry dressing during the daytime.  Try to keep the wound dry and avoid ointments on the wound unless directed to do so.  If the wound becomes bright red and painful or starts to drain infected material that is not clear, please contact your physician immediately.  If the wound is mildly pink and has a thick firm ridge underneath it, this is normal, and is referred to as a healing ridge.  This will resolve over the next 4-6 weeks.  BATHING: You may shower if you have been informed of this by your surgeon. However, Please do not submerge in a tub, hot tub, or pool until incisions are completely sealed or have been told by your surgeon that you may do so.  DIET:  You may eat any foods that you can tolerate.  It is a good idea to eat a high fiber diet and take in plenty of fluids to prevent constipation.  If you do become constipated you may want to take a mild laxative or take ducolax tablets on a daily basis until your bowel habits are regular.  Constipation can be very uncomfortable, along with straining, after recent surgery.  ACTIVITY:  You are encouraged to cough and deep breath or use your incentive spirometer if you were given one, every 15-30 minutes when awake.  This will help prevent respiratory complications and low grade fevers post-operatively if you had a general anesthetic.  You may want to hug a pillow when coughing and sneezing to add additional support to the surgical area, if you had abdominal or chest surgery, which will decrease pain  during these times.  You are encouraged to walk and engage in light activity for the next two weeks.  You should not lift more than 20 pounds for 6 weeks total after surgery as it could put you at increased risk for complications.  Twenty pounds is roughly equivalent to a plastic bag of groceries. At that time- Listen to your body when lifting, if you have pain when lifting, stop and then try again in a few days. Soreness after doing exercises or activities of daily living is normal as you get back in to your normal routine.  MEDICATIONS:  Try to take narcotic medications and anti-inflammatory medications, such as tylenol, ibuprofen, naprosyn, etc., with food.  This will minimize stomach upset from the medication.  Should you develop nausea and vomiting from the pain medication, or develop a rash, please discontinue the medication and contact your physician.  You should not drive, make important decisions, or operate machinery when taking narcotic pain medication.  SUNBLOCK Use sun block to incision area over the next year if this area will be exposed to sun. This helps decrease scarring and will allow you avoid a permanent darkened area over your incision.  QUESTIONS:  Please feel free to call our office if you have any questions, and we will be glad to assist you. [phone]   

## 2022-02-03 ENCOUNTER — Telehealth: Payer: Self-pay | Admitting: *Deleted

## 2022-02-03 NOTE — Telephone Encounter (Signed)
Faxed FMLA to [employer] to [phone]

## 2023-03-27 ENCOUNTER — Ambulatory Visit
Admission: EM | Admit: 2023-03-27 | Discharge: 2023-03-27 | Disposition: A | Discharge status: Home / Self Care | Payer: No Typology Code available for payment source | Attending: Emergency Medicine | Admitting: Emergency Medicine

## 2023-03-27 ENCOUNTER — Other Ambulatory Visit: Payer: Self-pay

## 2023-03-27 ENCOUNTER — Encounter: Payer: Self-pay | Admitting: Emergency Medicine

## 2023-03-27 DIAGNOSIS — J069 Acute upper respiratory infection, unspecified: Secondary | ICD-10-CM | POA: Diagnosis not present

## 2023-03-27 MED ORDER — ALBUTEROL SULFATE HFA 108 (90 BASE) MCG/ACT IN AERS: 2.0000 | INHALATION_SPRAY | RESPIRATORY_TRACT | 0 refills | Status: AC | PRN

## 2023-03-27 MED ORDER — AZITHROMYCIN 250 MG PO TABS: 250.0000 mg | ORAL_TABLET | Freq: Every day | ORAL | 0 refills | Status: AC

## 2023-03-27 MED ORDER — PREDNISONE 20 MG PO TABS: 40.0000 mg | ORAL_TABLET | Freq: Every day | ORAL | 0 refills | Status: AC

## 2023-03-27 MED ORDER — BENZONATATE 100 MG PO CAPS: 100.0000 mg | ORAL_CAPSULE | Freq: Three times a day (TID) | ORAL | 0 refills | Status: AC

## 2023-03-27 MED ORDER — PROMETHAZINE-DM 6.25-15 MG/5ML PO SYRP: 5.0000 mL | ORAL_SOLUTION | Freq: Every evening | ORAL | 0 refills | Status: AC | PRN

## 2023-03-27 NOTE — ED Provider Notes (Signed)
Gregory Melton    CSN: [id number] Arrival date & time: 03/27/23  9528      History   Chief Complaint Chief Complaint  Patient presents with   Cough    HPI Gregory Melton is a 52 y.o. male.   Patient presents for evaluation of malaise, fatigue, fever, chills nasal congestion, rhinorrhea, a scratchy throat, productive cough, shortness of breath and wheezing present for 8 days.  Fever peaking at 100.6.  Sputum described as yellow to brown and cough has been persistent and harsh, exacerbated when laying down.  Shortness of breath experienced at rest, exacerbated with exertion.  Wheezing at times triggering cough.  Decreased appetite but tolerating some food and fluids.  Home COVID test negative.  Sick contact in household.  Daily tobacco use.  History of childhood asthma.  History of bronchitis and pneumonia.  Has attempted use of NyQuil, DayQuil and TheraFlu.  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   GERD (gastroesophageal reflux disease)    Headache    migraines   Hypertension     Patient Active Problem List   Diagnosis Date Noted   Abscess 01/31/2017   HTN (hypertension) 01/31/2017   Boil of buttock 01/31/2017   Healthcare maintenance 01/31/2017    Past Surgical History:  Procedure Laterality Date   ADENOIDECTOMY     as a child   INSERTION OF MESH  01/11/2022   Procedure: INSERTION OF MESH;  Surgeon: Leafy Ro, MD;  Location: [facility] ORS;  Service: General;;  umbilical   KNEE ARTHROSCOPY Right    early 30's-meniscus tear   UMBILICAL HERNIA REPAIR N/A 01/11/2022   Procedure: HERNIA REPAIR UMBILICAL ADULT;  Surgeon: Leafy Ro, MD;  Location: [facility] ORS;  Service: General;  Laterality: N/A;       Home Medications    Prior to Admission medications   Medication Sig Start Date End Date Taking? Authorizing Provider  albuterol (VENTOLIN HFA) 108 (90 Base) MCG/ACT inhaler Inhale 2 puffs into the lungs every 4  (four) hours as needed for wheezing or shortness of breath. 03/27/23  Yes [facility] Teegarden R, NP  azithromycin (ZITHROMAX) 250 MG tablet Take 1 tablet (250 mg total) by mouth daily. Take first 2 tablets together, then 1 every day until finished. 03/27/23  Yes Stephonie Wilcoxen R, NP  benzonatate (TESSALON) 100 MG capsule Take 1 capsule (100 mg total) by mouth every 8 (eight) hours. 03/27/23  Yes Lylianna Fraiser R, NP  predniSONE (DELTASONE) 20 MG tablet Take 2 tablets (40 mg total) by mouth daily. 03/27/23  Yes Saraih Lorton R, NP  promethazine-dextromethorphan (PROMETHAZINE-DM) 6.25-15 MG/5ML syrup Take 5 mLs by mouth at bedtime as needed for cough. 03/27/23  Yes Jaykwon Morones R, NP  famotidine (PEPCID) 20 MG tablet Take 1 tablet (20 mg total) by mouth 2 (two) times daily. Patient not taking: Reported on 01/31/2022 12/21/21   Sharman Cheek, MD  HYDROcodone-acetaminophen (NORCO/VICODIN) 5-325 MG tablet Take 1-2 tablets by mouth every 4 (four) hours as needed for moderate pain. Patient not taking: Reported on 03/27/2023 01/11/22   Leafy Ro, MD  losartan (COZAAR) 25 MG tablet Take 25 mg by mouth every evening. Patient not taking: Reported on 03/27/2023 11/23/21   [provider]  sucralfate (CARAFATE) 1 g tablet Take 1 tablet (1 g total) by mouth 4 (four) times daily. Patient not taking: Reported on 03/27/2023 12/21/21   Sharman Cheek, MD  Family History Family History  Problem Relation Age of Onset   Diabetes Maternal Grandmother     Social History Social History   Tobacco Use   Smoking status: Every Day    Current packs/day: 0.50    Average packs/day: 0.5 packs/day for 27.7 years (13.8 ttl pk-yrs)    Types: Cigarettes    Start date: 07/19/1995   Smokeless tobacco: Never  Vaping Use   Vaping status: Never Used  Substance Use Topics   Alcohol use: Yes    Alcohol/week: 6.0 standard drinks of alcohol    Types: 6 Cans of beer per week    Comment: occ beer 2 nights week   Drug use: No      Allergies   Penicillins   Review of Systems Review of Systems   Physical Exam Triage Vital Signs ED Triage Vitals  Encounter Vitals Group     BP 03/27/23 1015 (!) 166/114     Systolic BP Percentile --      Diastolic BP Percentile --      Pulse Rate 03/27/23 1015 97     Resp 03/27/23 1015 20     Temp 03/27/23 1015 98.2 F (36.8 C)     Temp Source 03/27/23 1015 Oral     SpO2 03/27/23 1015 95 %     Weight --      Height --      Head Circumference --      Peak Flow --      Pain Score 03/27/23 1011 3     Pain Loc --      Pain Education --      Exclude from Growth Chart --    No data found.  Updated Vital Signs BP (!) 153/102 (BP Location: Left Arm) Comment (BP Location): large cuff  Pulse 97   Temp 98.2 F (36.8 C) (Oral)   Resp 20   SpO2 95%   Visual Acuity Right Eye Distance:   Left Eye Distance:   Bilateral Distance:    Right Eye Near:   Left Eye Near:    Bilateral Near:     Physical Exam Constitutional:      Appearance: Normal appearance.  HENT:     Right Ear: Tympanic membrane, ear canal and external ear normal.     Left Ear: Tympanic membrane, ear canal and external ear normal.     Nose: Congestion present. No rhinorrhea.     Mouth/Throat:     Mouth: Mucous membranes are moist.     Pharynx: Posterior oropharyngeal erythema present. No oropharyngeal exudate.  Eyes:     Extraocular Movements: Extraocular movements intact.  Cardiovascular:     Rate and Rhythm: Normal rate and regular rhythm.     Pulses: Normal pulses.     Heart sounds: Normal heart sounds.  Pulmonary:     Effort: Pulmonary effort is normal.     Breath sounds: Normal breath sounds.  Musculoskeletal:     Cervical back: Normal range of motion and neck supple.  Skin:    General: Skin is warm and dry.  Neurological:     Mental Status: He is alert and oriented to person, place, and time. Mental status is at baseline.      UC Treatments / Results  Labs (all labs ordered  are listed, but only abnormal results are displayed) Labs Reviewed - No data to display  EKG   Radiology No results found.  Procedures Procedures (including critical care time)  Medications Ordered in UC  Medications - No data to display  Initial Impression / Assessment and Plan / UC Course  I have reviewed the triage vital signs and the nursing notes.  Pertinent labs & imaging results that were available during my care of the patient were reviewed by me and considered in my medical decision making (see chart for details).  Acute URI  Vital signs are stable, patient is in no signs of distress nontoxic-appearing, lungs are clear and O2 saturation 95% on room air, at this time after discussion with patient will defer x-ray imaging, experiencing shortness of breath and wheezing at home therefore started prophylactically on antibiotics, prescribed azithromycin and prednisone additionally sent Tessalon, Promethazine DM and albuterol inhaler, recommended additional over-the-counter medications to be used as needed for supportive care and given strict precautions that if symptoms persist or worsen he is to follow-up for reevaluation and at that time imaging will be completed Final Clinical Impressions(s) / UC Diagnoses   Final diagnoses:  Acute URI     Discharge Instructions      Begin azithromycin to provide coverage for bacteria  Begin prednisone every morning with food for 5 days to open and relax the airway, should help settle shortness of breath and wheezing  May take 2 puffs of albuterol inhaler every 4-6 hours as needed for shortness of breath and wheezing  May use Tessalon pill every 8 hours as needed for coughing  Use cough syrup at bedtime for additional comfort, be mindful this make you feel sleepy    You can take Tylenol and/or Ibuprofen as needed for fever reduction and pain relief.   For cough: honey 1/2 to 1 teaspoon (you can dilute the honey in water or another  fluid).  You can also use guaifenesin and dextromethorphan for cough. You can use a humidifier for chest congestion and cough.  If you don't have a humidifier, you can sit in the bathroom with the hot shower running.      For sore throat: try warm salt water gargles, cepacol lozenges, throat spray, warm tea or water with lemon/honey, popsicles or ice, or OTC cold relief medicine for throat discomfort.   For congestion: take a daily anti-histamine like Zyrtec, Claritin, and a oral decongestant, such as pseudoephedrine.  You can also use Flonase 1-2 sprays in each nostril daily.   It is important to stay hydrated: drink plenty of fluids (water, gatorade/powerade/pedialyte, juices, or teas) to keep your throat moisturized and help further relieve irritation/discomfort.    ED Prescriptions     Medication Sig Dispense Auth. Provider   azithromycin (ZITHROMAX) 250 MG tablet Take 1 tablet (250 mg total) by mouth daily. Take first 2 tablets together, then 1 every day until finished. 6 tablet Latacha Texeira R, NP   predniSONE (DELTASONE) 20 MG tablet Take 2 tablets (40 mg total) by mouth daily. 10 tablet Syncere Eble R, NP   benzonatate (TESSALON) 100 MG capsule Take 1 capsule (100 mg total) by mouth every 8 (eight) hours. 21 capsule Amir Glaus R, NP   promethazine-dextromethorphan (PROMETHAZINE-DM) 6.25-15 MG/5ML syrup Take 5 mLs by mouth at bedtime as needed for cough. 118 mL Rual Vermeer R, NP   albuterol (VENTOLIN HFA) 108 (90 Base) MCG/ACT inhaler Inhale 2 puffs into the lungs every 4 (four) hours as needed for wheezing or shortness of breath. 18 g Valinda Hoar, NP      PDMP not reviewed this encounter.   Valinda Hoar, NP 03/27/23 1105

## 2023-03-27 NOTE — ED Triage Notes (Signed)
03/18/2023 woke not feeling well, "not feeling right".  Patient started with cough, foul taste.  Took theraflu.  Did a covid test at that time-Negative result.  Since then has worsened.  Patient has a cough, congestion, phlegm is yellow -brown.  Patient reports fever was 100.6.  patient reports poor appetite.    Has had nyquil, dayquil.    Girlfriend had pneumonia 2 weeks

## 2023-03-27 NOTE — Discharge Instructions (Addendum)
Begin azithromycin to provide coverage for bacteria  Begin prednisone every morning with food for 5 days to open and relax the airway, should help settle shortness of breath and wheezing  May take 2 puffs of albuterol inhaler every 4-6 hours as needed for shortness of breath and wheezing  May use Tessalon pill every 8 hours as needed for coughing  Use cough syrup at bedtime for additional comfort, be mindful this make you feel sleepy    You can take Tylenol and/or Ibuprofen as needed for fever reduction and pain relief.   For cough: honey 1/2 to 1 teaspoon (you can dilute the honey in water or another fluid).  You can also use guaifenesin and dextromethorphan for cough. You can use a humidifier for chest congestion and cough.  If you don't have a humidifier, you can sit in the bathroom with the hot shower running.      For sore throat: try warm salt water gargles, cepacol lozenges, throat spray, warm tea or water with lemon/honey, popsicles or ice, or OTC cold relief medicine for throat discomfort.   For congestion: take a daily anti-histamine like Zyrtec, Claritin, and a oral decongestant, such as pseudoephedrine.  You can also use Flonase 1-2 sprays in each nostril daily.   It is important to stay hydrated: drink plenty of fluids (water, gatorade/powerade/pedialyte, juices, or teas) to keep your throat moisturized and help further relieve irritation/discomfort.

## 2023-09-11 IMAGING — CT CT ABD-PELV W/ CM
2 of 4 series · 16 of 46 positions shown, 18 images · IV contrast (agent unspecified)
Comparison: Ultrasound from earlier in the same day.

CLINICAL DATA: Abdominal pain for several days, initial encounter

EXAM:
CT ABDOMEN AND PELVIS WITH CONTRAST
TECHNIQUE: Multidetector CT imaging of the abdomen and pelvis was performed
using the standard protocol following bolus administration of
intravenous contrast.

[Series 2: routine abd/pel with · axial · 0.98mm/px · z∈[-956,-511]mm · 13 of 97 slices shown, 15 images]
[im 4/97  soft-tissue]
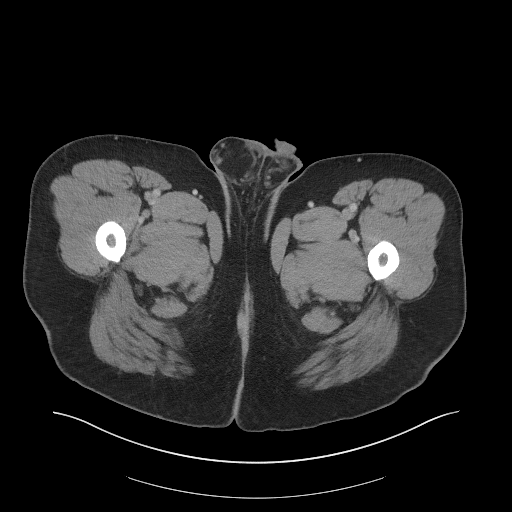
[im 4/97  bone]
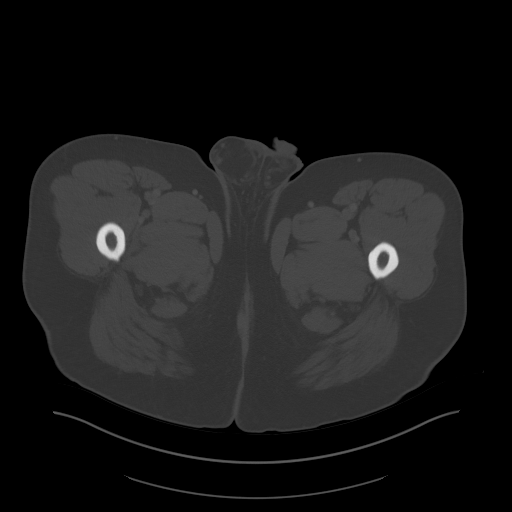
[im 12/97  soft-tissue]
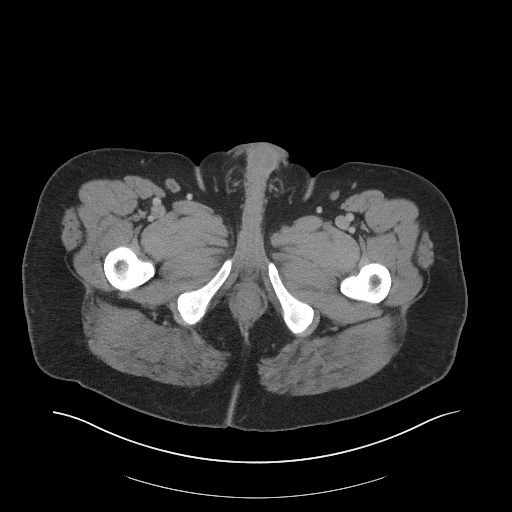
[im 19/97  soft-tissue]
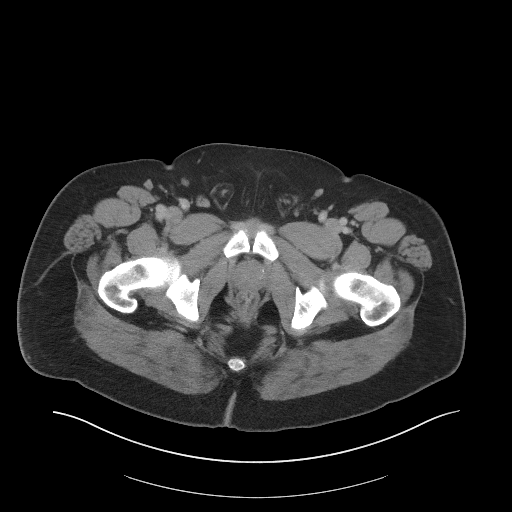
[im 26/97  soft-tissue]
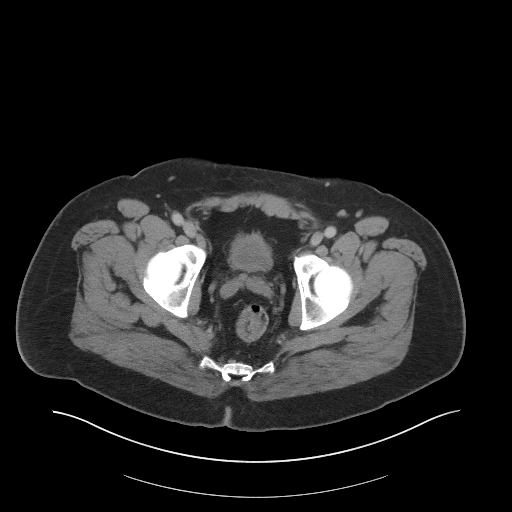
[im 34/97  soft-tissue]
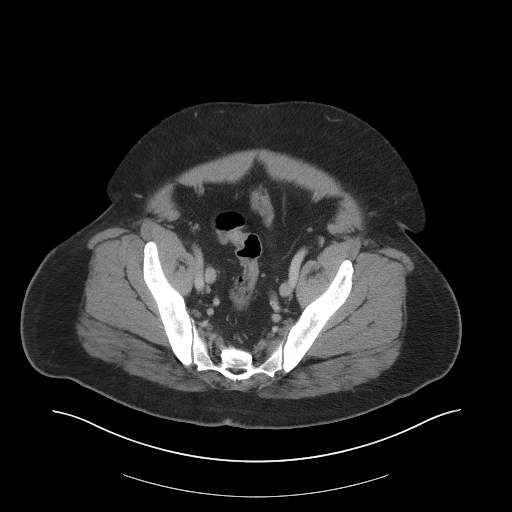
[im 41/97  soft-tissue]
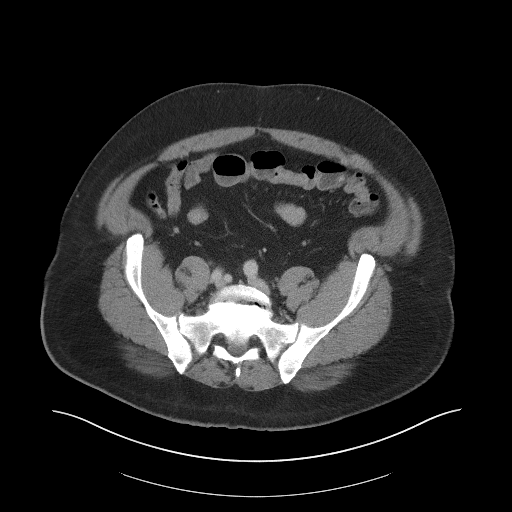
[im 49/97  soft-tissue]
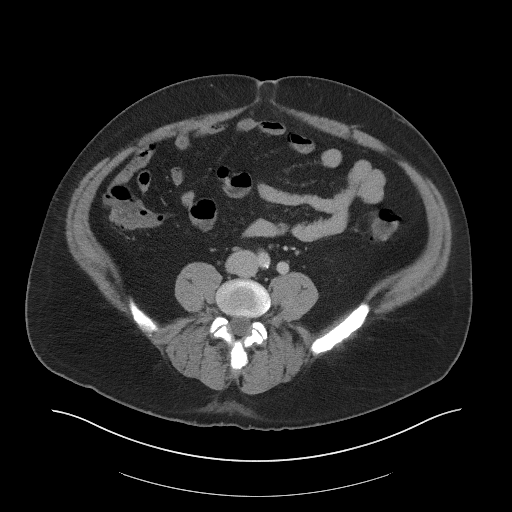
[im 56/97  soft-tissue]
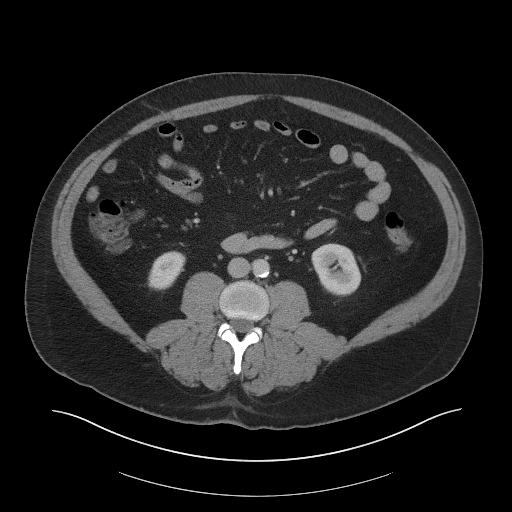
[im 63/97  soft-tissue]
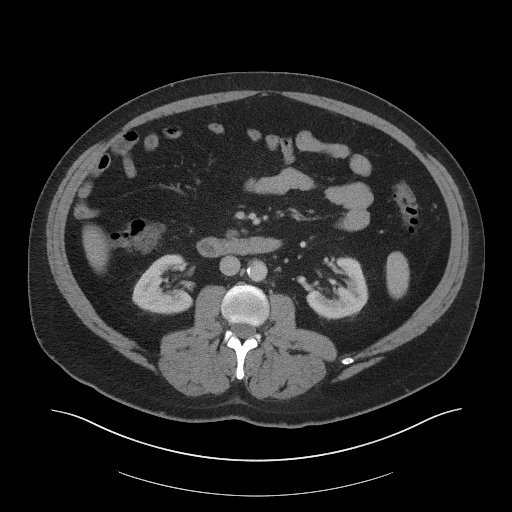
[im 63/97  bone]
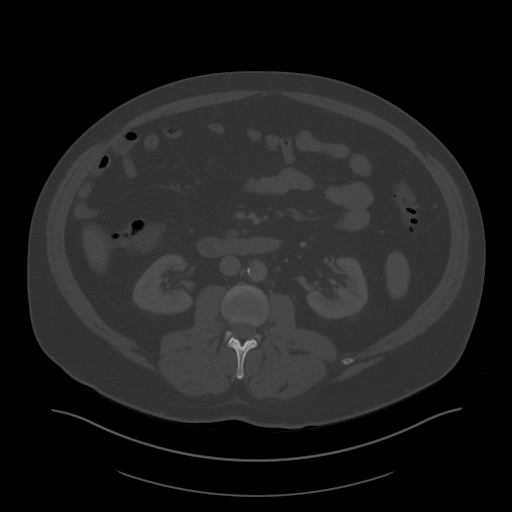
[im 71/97  soft-tissue]
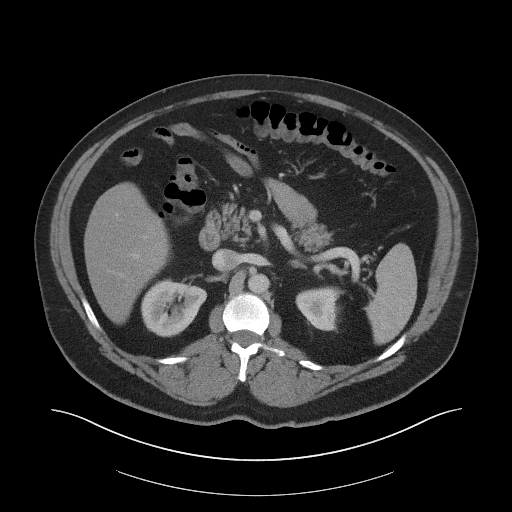
[im 78/97  soft-tissue]
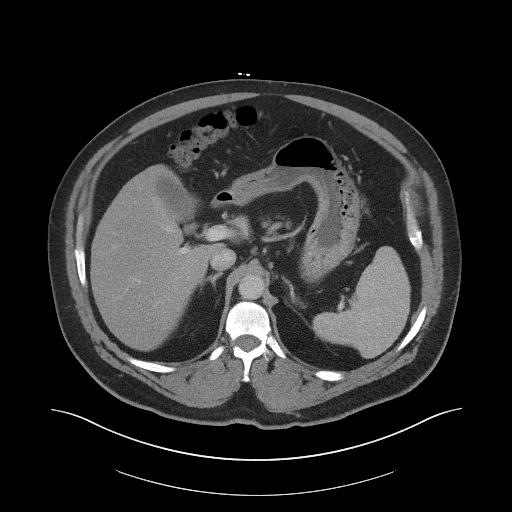
[im 85/97  soft-tissue]
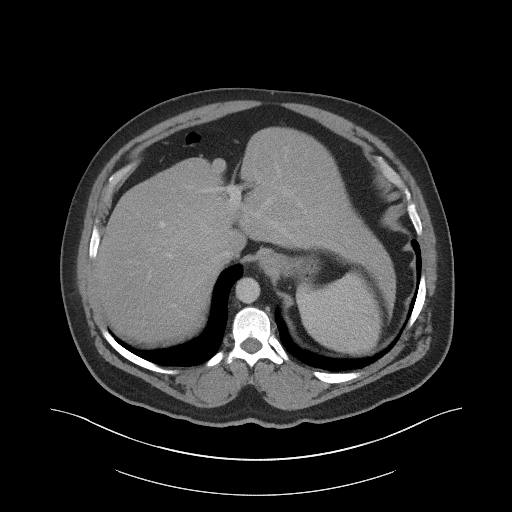
[im 93/97  soft-tissue]
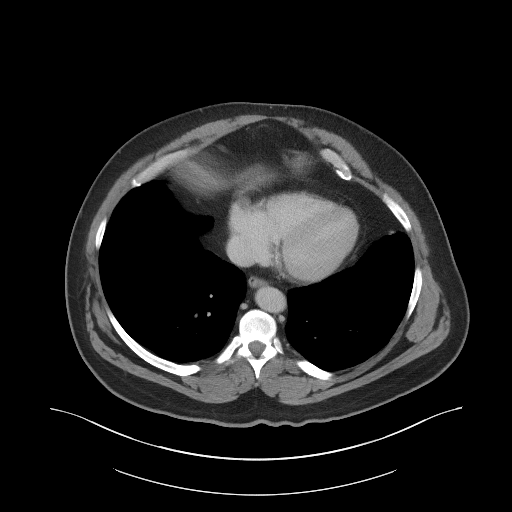

[Series 5: coronal st · coronal · 0.88mm/px · 3 of 117 slices shown]
[im 39/117  soft-tissue]
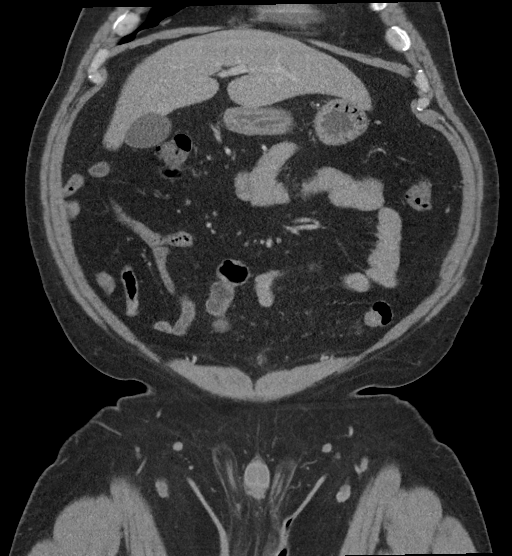
[im 52/117  soft-tissue]
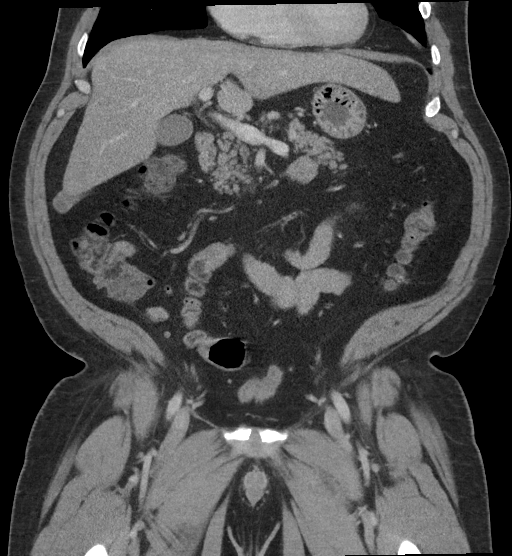
[im 65/117  soft-tissue]
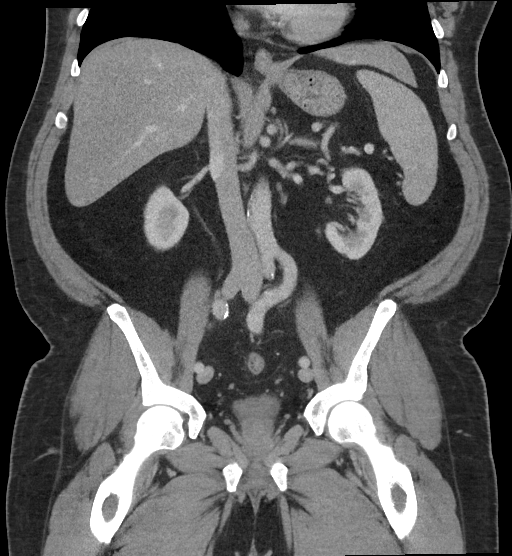

[16 of 46 positions shown; findings below may reference images not displayed]

RADIATION DOSE REDUCTION: This exam was performed according to the
departmental dose-optimization program which includes automated
exposure control, adjustment of the mA and/or kV according to
patient size and/or use of iterative reconstruction technique.

CONTRAST:  100mL OMNIPAQUE IOHEXOL 350 MG/ML SOLN
FINDINGS: Lower chest: No acute abnormality.

Hepatobiliary: Mild fatty infiltration of the liver is noted.
Gallbladder is within normal limits.

Pancreas: Unremarkable. No pancreatic ductal dilatation or
surrounding inflammatory changes.

Spleen: Normal in size without focal abnormality.

Adrenals/Urinary Tract: Adrenal glands are within normal limits.
Kidneys demonstrate a normal enhancement pattern. No renal calculi
or obstructive changes are seen. The ureters are within normal
limits bilaterally. Bladder is decompressed.

Stomach/Bowel: The appendix is within normal limits. No obstructive
or inflammatory changes of colon are seen. Stomach and small bowel
are within normal limits.

Vascular/Lymphatic: Aortic atherosclerosis. No enlarged abdominal or
pelvic lymph nodes.

Reproductive: Prostate is unremarkable.

Other: No abdominal wall hernia or abnormality. No abdominopelvic
ascites.

Musculoskeletal: Bilateral L5 pars defects are noted without
complicating factors.
IMPRESSION: Fatty liver similar to that seen on recent ultrasound.

No acute abnormality is noted in the abdomen and pelvis.

## 2023-09-11 IMAGING — US US ABDOMEN COMPLETE
1 series · 13 of 25 positions shown · non-contrast
Comparison: Report only from 04/15/2009 abdominal ultrasound.

CLINICAL DATA: Upper abdominal pain.  Distention.

EXAM:
ABDOMEN ULTRASOUND COMPLETE

[Series 1: us abdomen complete · 13 of 63 slices shown]
[im 1/63]
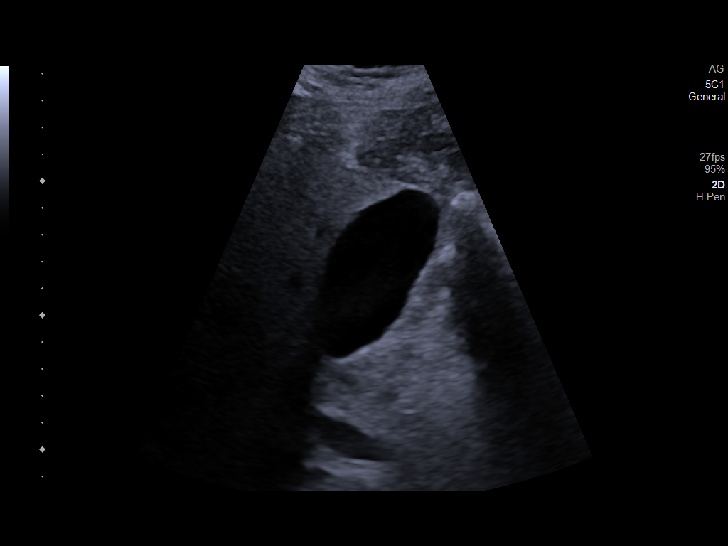
[im 6/63]
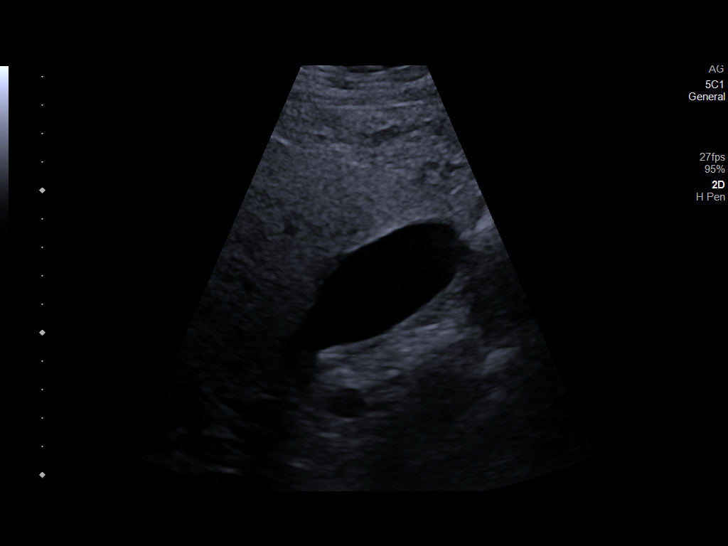
[im 11/63]
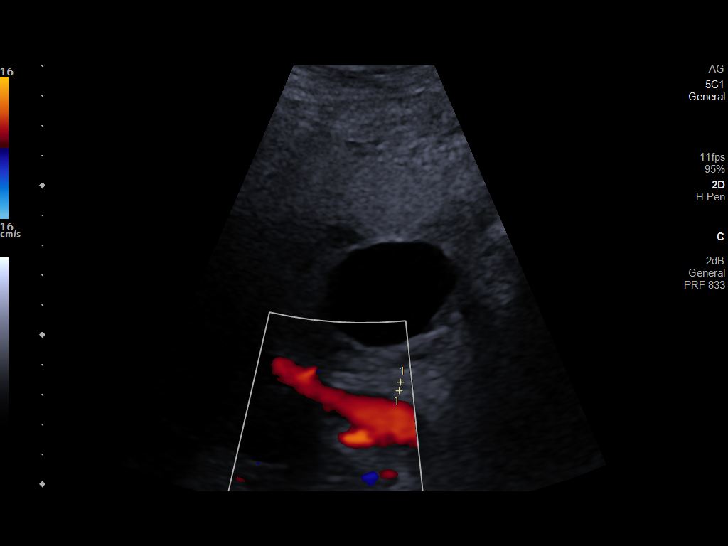
[im 16/63]
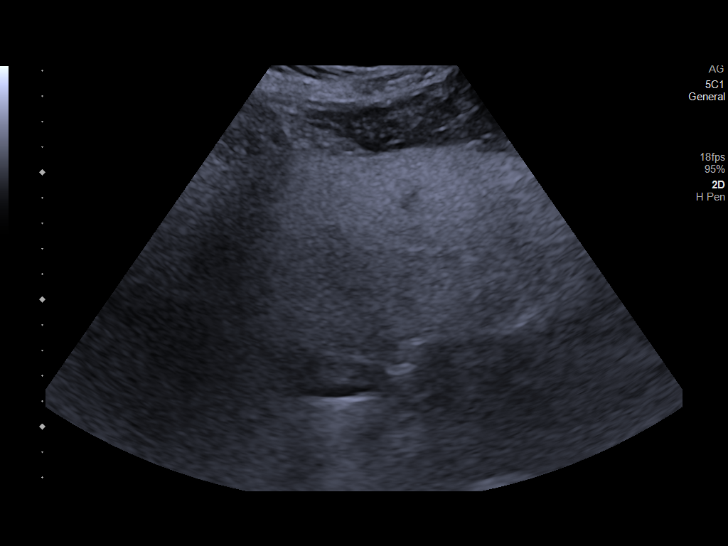
[im 21/63]
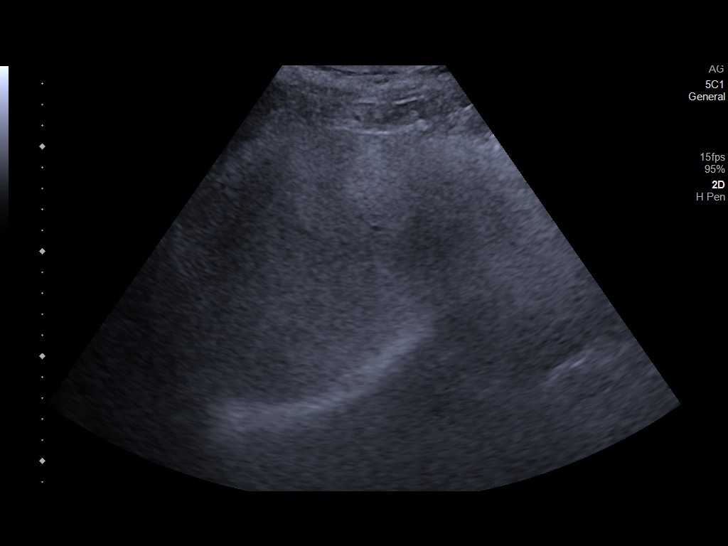
[im 26/63]
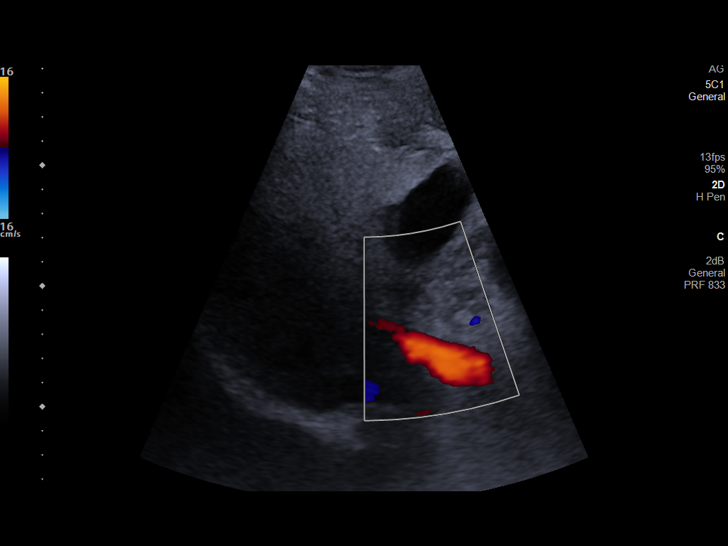
[im 32/63]
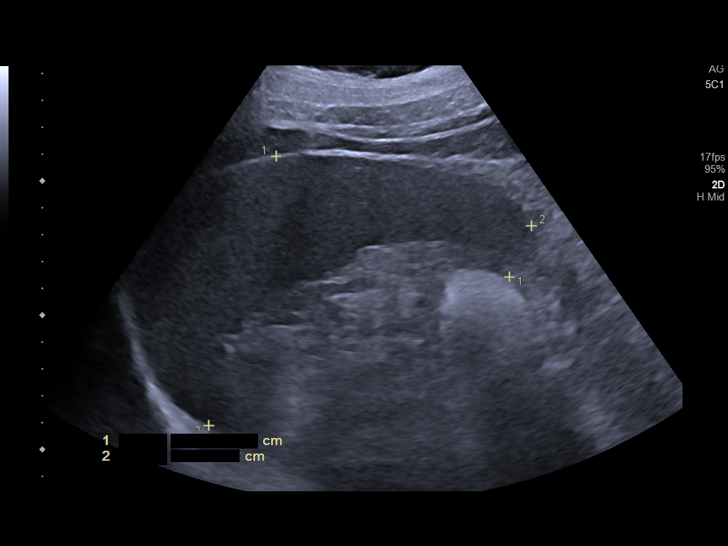
[im 37/63]
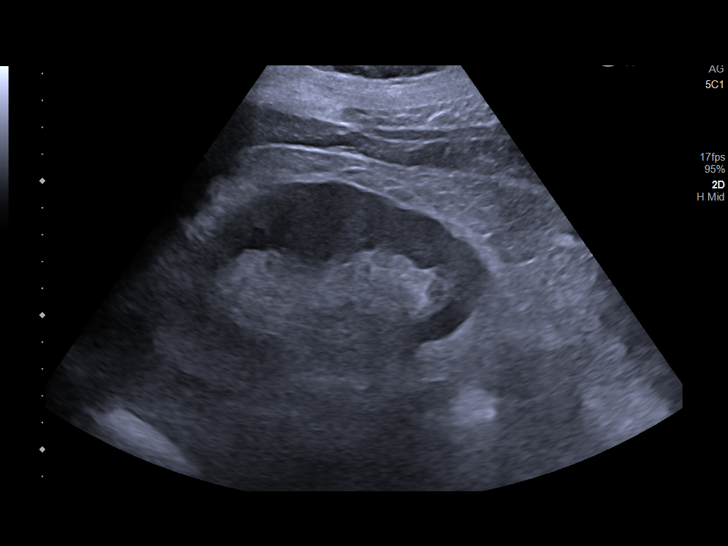
[im 42/63]
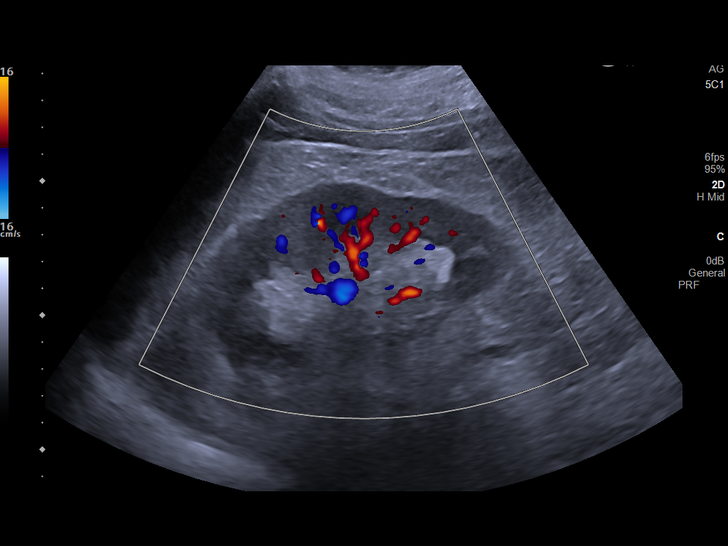
[im 47/63]
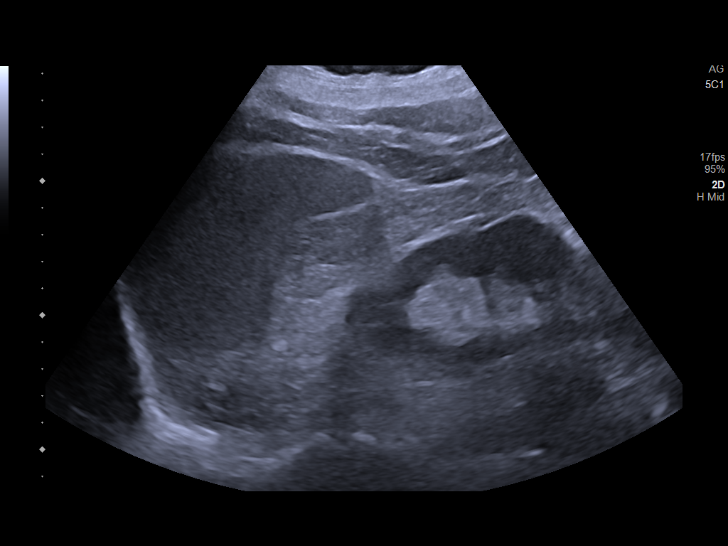
[im 52/63]
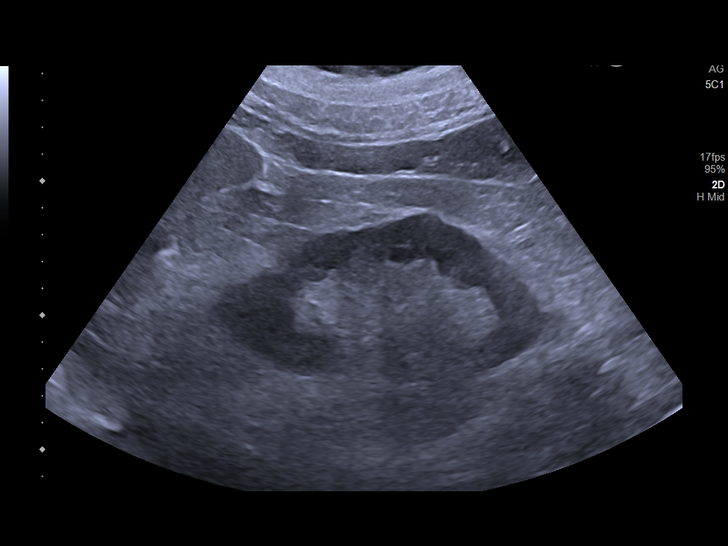
[im 57/63]
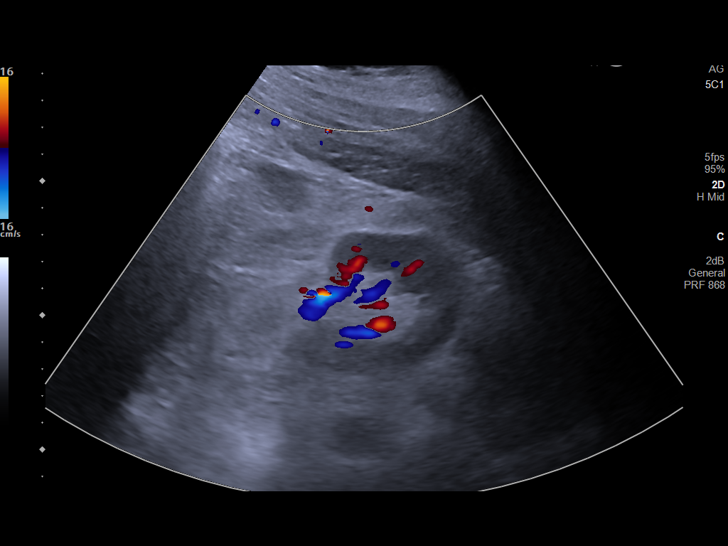
[im 63/63]
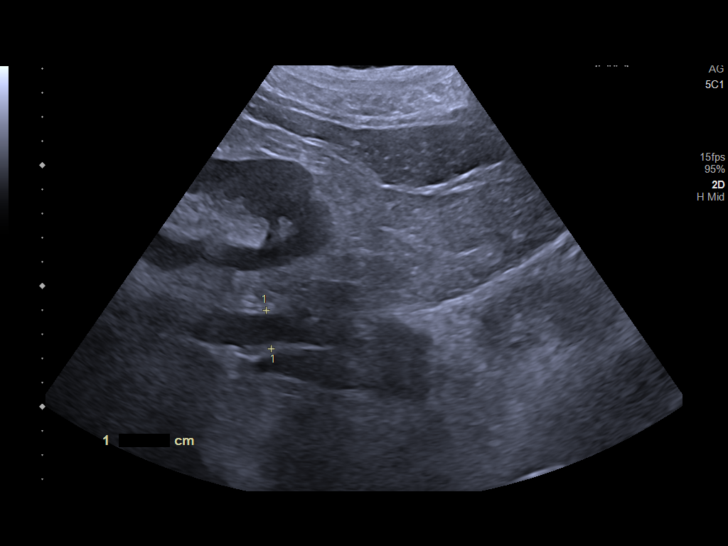

[13 of 25 positions shown; findings below may reference images not displayed]

FINDINGS: Gallbladder: No gallstones or wall thickening visualized. No
sonographic Murphy sign noted by sonographer.

Common bile duct: Diameter: 3 mm. No intrahepatic or extrahepatic
biliary ductal dilatation.

Liver: Smooth liver contours. There is moderately increased
echogenicity and attenuation of the liver compatible with fatty
infiltration. Portal vein is patent on color Doppler imaging with
normal direction of blood flow towards the liver.

IVC: No abnormality visualized.

Pancreas: The pancreas is not well evaluated likely due to overlying
bowel gas.

Spleen: The spleen measures approximately 4.1 x 9.8 x 13.4 cm, again
enlarged.

Right Kidney: Length: 11.3 cm. Echogenicity within normal limits. No
mass or hydronephrosis visualized.

Left Kidney: Length: 12.3 cm. Echogenicity within normal limits. No
mass or hydronephrosis visualized.

Abdominal aorta: No aneurysm visualized.

Other findings: None.
IMPRESSION: 1. Moderate increased echogenicity and attenuation of the liver
parenchyma suggesting fatty infiltration.
2. Normal appearance of the gallbladder.
3. Mild splenomegaly, as also described on prior remote 9161
abdominal ultrasound report.
